# Patient Record
Sex: Female | Born: 1985 | Race: Black or African American | Hispanic: No | Marital: Single | Smoking: Current every day smoker
Health system: Southern US, Community
[De-identification: ages and names within clinical notes are randomized; demographics above are authoritative.]

## PROBLEM LIST (undated history)

## (undated) ENCOUNTER — Inpatient Hospital Stay: Payer: Self-pay

## (undated) DIAGNOSIS — Z789 Other specified health status: Secondary | ICD-10-CM

## (undated) HISTORY — DX: Other specified health status: Z78.9

## (undated) HISTORY — PX: OTHER SURGICAL HISTORY: SHX169

---

## 2007-06-25 ENCOUNTER — Emergency Department: Payer: Self-pay | Admitting: Emergency Medicine

## 2010-03-24 ENCOUNTER — Ambulatory Visit: Payer: Self-pay | Admitting: Orthopedic Surgery

## 2010-12-28 ENCOUNTER — Emergency Department (HOSPITAL_COMMUNITY)
Admission: EM | Admit: 2010-12-28 | Discharge: 2010-12-28 | Payer: Self-pay | Source: Home / Self Care | Admitting: Emergency Medicine

## 2011-02-10 ENCOUNTER — Emergency Department (HOSPITAL_COMMUNITY)
Admission: EM | Admit: 2011-02-10 | Discharge: 2011-02-10 | Disposition: A | Discharge status: Home / Self Care | Payer: Medicaid Other | Attending: Emergency Medicine | Admitting: Emergency Medicine

## 2011-02-10 DIAGNOSIS — M25519 Pain in unspecified shoulder: Secondary | ICD-10-CM | POA: Insufficient documentation

## 2011-02-10 DIAGNOSIS — Z79899 Other long term (current) drug therapy: Secondary | ICD-10-CM | POA: Insufficient documentation

## 2013-09-26 LAB — POCT WET PREP (WET MOUNT)

## 2014-11-22 LAB — POCT WET PREP (WET MOUNT)

## 2016-06-29 ENCOUNTER — Emergency Department (HOSPITAL_COMMUNITY)
Admission: EM | Admit: 2016-06-29 | Discharge: 2016-06-29 | Disposition: A | Discharge status: Home / Self Care | Payer: Medicaid Other | Attending: Emergency Medicine | Admitting: Emergency Medicine

## 2016-06-29 ENCOUNTER — Encounter (HOSPITAL_COMMUNITY): Payer: Self-pay | Admitting: Emergency Medicine

## 2016-06-29 DIAGNOSIS — R109 Unspecified abdominal pain: Secondary | ICD-10-CM

## 2016-06-29 DIAGNOSIS — M549 Dorsalgia, unspecified: Secondary | ICD-10-CM | POA: Insufficient documentation

## 2016-06-29 DIAGNOSIS — O26891 Other specified pregnancy related conditions, first trimester: Secondary | ICD-10-CM | POA: Insufficient documentation

## 2016-06-29 DIAGNOSIS — F17200 Nicotine dependence, unspecified, uncomplicated: Secondary | ICD-10-CM | POA: Insufficient documentation

## 2016-06-29 DIAGNOSIS — O23511 Infections of cervix in pregnancy, first trimester: Secondary | ICD-10-CM | POA: Insufficient documentation

## 2016-06-29 DIAGNOSIS — Z79899 Other long term (current) drug therapy: Secondary | ICD-10-CM | POA: Diagnosis not present

## 2016-06-29 DIAGNOSIS — R51 Headache: Secondary | ICD-10-CM | POA: Diagnosis not present

## 2016-06-29 DIAGNOSIS — N72 Inflammatory disease of cervix uteri: Secondary | ICD-10-CM

## 2016-06-29 DIAGNOSIS — O26899 Other specified pregnancy related conditions, unspecified trimester: Secondary | ICD-10-CM

## 2016-06-29 DIAGNOSIS — O99331 Smoking (tobacco) complicating pregnancy, first trimester: Secondary | ICD-10-CM | POA: Diagnosis not present

## 2016-06-29 DIAGNOSIS — O4691 Antepartum hemorrhage, unspecified, first trimester: Secondary | ICD-10-CM | POA: Diagnosis present

## 2016-06-29 LAB — URINE MICROSCOPIC-ADD ON: Bacteria, UA: NONE SEEN

## 2016-06-29 LAB — CBC WITH DIFFERENTIAL/PLATELET
Basophils Absolute: 0 10*3/uL (ref 0.0–0.1)
Basophils Relative: 0 %
EOS ABS: 0.1 10*3/uL (ref 0.0–0.7)
EOS PCT: 1 %
HCT: 34.8 % — ABNORMAL LOW (ref 36.0–46.0)
HEMOGLOBIN: 12.1 g/dL (ref 12.0–15.0)
Lymphocytes Relative: 16 %
Lymphs Abs: 1.5 10*3/uL (ref 0.7–4.0)
MCH: 33 pg (ref 26.0–34.0)
MCHC: 34.8 g/dL (ref 30.0–36.0)
MCV: 94.8 fL (ref 78.0–100.0)
Monocytes Absolute: 0.4 10*3/uL (ref 0.1–1.0)
Monocytes Relative: 5 %
NEUTROS PCT: 78 %
Neutro Abs: 7.4 10*3/uL (ref 1.7–7.7)
PLATELETS: 206 10*3/uL (ref 150–400)
RBC: 3.67 MIL/uL — ABNORMAL LOW (ref 3.87–5.11)
RDW: 13.1 % (ref 11.5–15.5)
WBC: 9.4 10*3/uL (ref 4.0–10.5)

## 2016-06-29 LAB — URINALYSIS, ROUTINE W REFLEX MICROSCOPIC
Bilirubin Urine: NEGATIVE
GLUCOSE, UA: NEGATIVE mg/dL
KETONES UR: NEGATIVE mg/dL
Nitrite: NEGATIVE
PROTEIN: NEGATIVE mg/dL
Specific Gravity, Urine: 1.005 (ref 1.005–1.030)
pH: 6.5 (ref 5.0–8.0)

## 2016-06-29 LAB — ABO/RH: ABO/RH(D): AB NEG

## 2016-06-29 LAB — WET PREP, GENITAL
Sperm: NONE SEEN
Trich, Wet Prep: NONE SEEN
Yeast Wet Prep HPF POC: NONE SEEN

## 2016-06-29 MED ORDER — LIDOCAINE HCL (PF) 1 % IJ SOLN
INTRAMUSCULAR | Status: AC
  Filled 2016-06-29: qty 5

## 2016-06-29 MED ORDER — AZITHROMYCIN 250 MG PO TABS
1000.0000 mg | ORAL_TABLET | Freq: Once | ORAL | Status: AC
  Administered 2016-06-29: 1000 mg via ORAL
  Filled 2016-06-29: qty 4

## 2016-06-29 MED ORDER — CEFTRIAXONE SODIUM 250 MG IJ SOLR
250.0000 mg | Freq: Once | INTRAMUSCULAR | Status: AC
  Administered 2016-06-29: 250 mg via INTRAMUSCULAR
  Filled 2016-06-29: qty 250

## 2016-06-29 NOTE — Discharge Instructions (Signed)
We have treated you today with antibiotics for cervicitis. Your cultures will not be back for 24 to 48 hours. We will call you if they are positive. You can also review your results on "My chart".

## 2016-06-29 NOTE — ED Notes (Signed)
Patient complaining of lower abdominal cramping and spotting from vagina starting this morning. States she is [redacted] weeks pregnant.

## 2016-06-29 NOTE — ED Provider Notes (Signed)
CSN: [id number]     Arrival date & time 06/29/16  [id number] History   First MD Initiated Contact with Patient 06/29/16 [phone]     Chief Complaint  Patient presents with  . Vaginal Bleeding     (Consider location/radiation/quality/duration/timing/severity/associated sxs/prior Treatment) Patient is a 30 y.o. female presenting with vaginal bleeding. The history is provided by the patient.  Vaginal Bleeding Quality:  Bright red and spotting Severity:  Mild Onset quality:  Sudden Timing:  Intermittent Progression:  Unchanged Chronicity:  New Possible pregnancy: yes   Context: spontaneously   Relieved by:  None tried Worsened by:  Nothing tried Ineffective treatments:  None tried Associated symptoms: abdominal pain (cramping), back pain and vaginal discharge   Associated symptoms: no dysuria, no fever and no nausea    Nancy MarusSamara Pylant is a 30 y.o. G3P2 @ 7755w4d gestation who presents to the ED with vaginal bleeding and cramping. She reports that the cramping started yesterday and she thought maybe she had just been walking to much. Today she noted vaginal bleeding that she describes as spotting. Hx of Chlamydia, current sex partner x 2 months, last pap smear one year ago and was normal. Last sexual intercourse 3 days ago.   History reviewed. No pertinent past medical history. History reviewed. No pertinent past surgical history. History reviewed. No pertinent family history. Social History  Substance Use Topics  . Smoking status: Current Every Day Smoker  . Smokeless tobacco: None  . Alcohol Use: No   OB History    Gravida Para Term Preterm AB TAB SAB Ectopic Multiple Living   1              Review of Systems  Constitutional: Negative for fever and chills.  Eyes: Negative for visual disturbance.  Respiratory: Negative for cough and shortness of breath.   Cardiovascular: Negative for chest pain and leg swelling.  Gastrointestinal: Positive for abdominal pain (cramping). Negative for  nausea and vomiting.  Genitourinary: Positive for frequency, vaginal bleeding and vaginal discharge. Negative for dysuria.  Musculoskeletal: Positive for back pain.  Skin: Negative for rash.  Allergic/Immunologic: Negative for food allergies.  Neurological: Positive for headaches. Negative for syncope.  Hematological: Negative for adenopathy.  Psychiatric/Behavioral: Negative for confusion. The patient is not nervous/anxious.       Allergies  Review of patient's allergies indicates no known allergies.  Home Medications   Prior to Admission medications   Medication Sig Start Date End Date Taking? Authorizing Provider  Prenatal Vit-Fe Fumarate-FA (MULTIVITAMIN-PRENATAL) 27-0.8 MG TABS tablet Take 1 tablet by mouth daily at 12 noon.   Yes Historical Provider, MD   BP 121/80 mmHg  Pulse 102  Temp(Src) 98.4 F (36.9 C) (Oral)  Resp 16  Ht 5\' 2"  (1.575 m)  Wt 63.504 kg  BMI 25.60 kg/m2  SpO2 98% Physical Exam  Constitutional: She is oriented to person, place, and time. She appears well-developed and well-nourished. No distress.  HENT:  Head: Normocephalic.  Eyes: EOM are normal.  Neck: Neck supple.  Cardiovascular: Normal rate and regular rhythm.   Pulmonary/Chest: Effort normal and breath sounds normal.  Abdominal: Soft. Bowel sounds are normal. There is tenderness in the left lower quadrant. There is no rebound, no guarding and no CVA tenderness.  Tenderness is mild  Genitourinary:  External genitalia without lesions, bloody yellow d/c vaginal vault. Cervix friable, external os 1 cm, internal os closed, mild CMT, no adnexal mass, mildly tender left, uterus approximately 12 week size.   Musculoskeletal: Normal range  of motion. She exhibits no edema.  Neurological: She is alert and oriented to person, place, and time. No cranial nerve deficit.  Skin: Skin is warm and dry.  Psychiatric: She has a normal mood and affect. Her behavior is normal.  Nursing note and vitals  reviewed.   ED Course  Procedures (including critical care time) Bedside ultrasound by Dr. Jodi Mourning (see his note)   Labs Review Labs Reviewed  WET PREP, GENITAL - Abnormal; Notable for the following:    Clue Cells Wet Prep HPF POC PRESENT (*)    WBC, Wet Prep HPF POC MANY (*)    All other components within normal limits  URINALYSIS, ROUTINE W REFLEX MICROSCOPIC (NOT AT [facility]) - Abnormal; Notable for the following:    Hgb urine dipstick LARGE (*)    Leukocytes, UA MODERATE (*)    All other components within normal limits  URINE MICROSCOPIC-ADD ON - Abnormal; Notable for the following:    Squamous Epithelial / LPF 0-5 (*)    All other components within normal limits  CBC WITH DIFFERENTIAL/PLATELET - Abnormal; Notable for the following:    RBC 3.67 (*)    HCT 34.8 (*)    All other components within normal limits  RPR  HIV ANTIBODY (ROUTINE TESTING)  ABO/RH  GC/CHLAMYDIA PROBE AMP ([place]) NOT AT [facility]    Imaging Review No results found. I have personally reviewed and evaluated the lab results as part of my medical decision-making.   MDM  30 y.o. female with vaginal bleeding (spotting) @ [redacted] weeks gestation stable for d/c without hemorrhage and cervix closed. Treat for cervicitis with Rocephin 250 mg IM and Zithromax 1 gram PO. Cultures for GC, Chlamydia pending. Discussed with the patient clinical and lab findings and plan of care. All questioned fully answered. She will f/u with her doctor at the health department for prenatal care or return if any problems arise.   Final diagnoses:  Cervicitis  Abdominal pain in pregnancy       Janne Napoleon, NP 06/29/16 1434  Blane Ohara, MD 06/29/16 [phone]

## 2016-06-29 NOTE — ED Notes (Signed)
Lab at bedside

## 2016-06-29 NOTE — ED Notes (Signed)
Ana Manning at bedside for pelvic exam. She was assisted by this nurse. NAD noted.

## 2016-06-30 LAB — RPR: RPR: NONREACTIVE

## 2016-06-30 LAB — GC/CHLAMYDIA PROBE AMP (~~LOC~~) NOT AT ARMC
Chlamydia: NEGATIVE
NEISSERIA GONORRHEA: NEGATIVE

## 2016-06-30 LAB — HIV ANTIBODY (ROUTINE TESTING W REFLEX): HIV Screen 4th Generation wRfx: NONREACTIVE

## 2016-07-14 ENCOUNTER — Emergency Department (HOSPITAL_COMMUNITY)
Admission: EM | Admit: 2016-07-14 | Discharge: 2016-07-14 | Discharge status: Against Medical Advice | Payer: Medicaid Other | Attending: Emergency Medicine | Admitting: Emergency Medicine

## 2016-07-14 ENCOUNTER — Encounter (HOSPITAL_COMMUNITY): Payer: Self-pay

## 2016-07-14 DIAGNOSIS — O99331 Smoking (tobacco) complicating pregnancy, first trimester: Secondary | ICD-10-CM | POA: Insufficient documentation

## 2016-07-14 DIAGNOSIS — O26891 Other specified pregnancy related conditions, first trimester: Secondary | ICD-10-CM | POA: Insufficient documentation

## 2016-07-14 DIAGNOSIS — Z3A14 14 weeks gestation of pregnancy: Secondary | ICD-10-CM | POA: Diagnosis not present

## 2016-07-14 DIAGNOSIS — Z791 Long term (current) use of non-steroidal anti-inflammatories (NSAID): Secondary | ICD-10-CM | POA: Insufficient documentation

## 2016-07-14 DIAGNOSIS — N898 Other specified noninflammatory disorders of vagina: Secondary | ICD-10-CM | POA: Diagnosis not present

## 2016-07-14 DIAGNOSIS — Z79899 Other long term (current) drug therapy: Secondary | ICD-10-CM | POA: Insufficient documentation

## 2016-07-14 DIAGNOSIS — F17210 Nicotine dependence, cigarettes, uncomplicated: Secondary | ICD-10-CM | POA: Diagnosis not present

## 2016-07-14 DIAGNOSIS — R109 Unspecified abdominal pain: Secondary | ICD-10-CM | POA: Diagnosis present

## 2016-07-14 LAB — URINE MICROSCOPIC-ADD ON

## 2016-07-14 LAB — CBC WITH DIFFERENTIAL/PLATELET
BASOS PCT: 0 %
Basophils Absolute: 0 10*3/uL (ref 0.0–0.1)
EOS ABS: 0.1 10*3/uL (ref 0.0–0.7)
Eosinophils Relative: 1 %
HEMATOCRIT: 33.2 % — AB (ref 36.0–46.0)
HEMOGLOBIN: 11.6 g/dL — AB (ref 12.0–15.0)
LYMPHS ABS: 2.5 10*3/uL (ref 0.7–4.0)
Lymphocytes Relative: 21 %
MCH: 33.6 pg (ref 26.0–34.0)
MCHC: 34.9 g/dL (ref 30.0–36.0)
MCV: 96.2 fL (ref 78.0–100.0)
MONO ABS: 0.9 10*3/uL (ref 0.1–1.0)
MONOS PCT: 7 %
NEUTROS ABS: 8.6 10*3/uL — AB (ref 1.7–7.7)
Neutrophils Relative %: 71 %
Platelets: 182 10*3/uL (ref 150–400)
RBC: 3.45 MIL/uL — ABNORMAL LOW (ref 3.87–5.11)
RDW: 13.3 % (ref 11.5–15.5)
WBC: 12.1 10*3/uL — ABNORMAL HIGH (ref 4.0–10.5)

## 2016-07-14 LAB — URINALYSIS, ROUTINE W REFLEX MICROSCOPIC
Bilirubin Urine: NEGATIVE
GLUCOSE, UA: NEGATIVE mg/dL
KETONES UR: NEGATIVE mg/dL
Nitrite: NEGATIVE
PH: 6 (ref 5.0–8.0)
PROTEIN: NEGATIVE mg/dL
Specific Gravity, Urine: >1.03 — ABNORMAL HIGH (ref 1.005–1.030)

## 2016-07-14 LAB — WET PREP, GENITAL
SPERM: NONE SEEN
TRICH WET PREP: NONE SEEN
Yeast Wet Prep HPF POC: NONE SEEN

## 2016-07-14 LAB — BASIC METABOLIC PANEL
Anion gap: 4 — ABNORMAL LOW (ref 5–15)
BUN: 12 mg/dL (ref 6–20)
CALCIUM: 9.3 mg/dL (ref 8.9–10.3)
CHLORIDE: 105 mmol/L (ref 101–111)
CO2: 25 mmol/L (ref 22–32)
CREATININE: 0.58 mg/dL (ref 0.44–1.00)
GFR calc non Af Amer: >60 mL/min (ref >60)
Glucose, Bld: 89 mg/dL (ref 65–99)
Potassium: 4 mmol/L (ref 3.5–5.1)
Sodium: 134 mmol/L — ABNORMAL LOW (ref 135–145)

## 2016-07-14 LAB — HCG, QUANTITATIVE, PREGNANCY: hCG, Beta Chain, Quant, S: 37260 m[IU]/mL — ABNORMAL HIGH (ref <5)

## 2016-07-14 NOTE — ED Provider Notes (Signed)
Emergency Department Provider Note  By signing my name below, I, Linna Darner, attest that this documentation has been prepared under the direction and in the presence of physician practitioner, Maia Plan, MD. Electronically Signed: Linna Darner, Scribe. 07/14/2016. 9:43 PM.   I have reviewed the triage vital signs and the nursing notes.   HISTORY  Chief Complaint Abdominal Pain  HPI Comments: Ana Manning is a 30 y.o. female who presents to the Emergency Department complaining of sudden onset, intermittent, cramping, abdominal pain beginning about 3 hours ago. Pt reports that she has noticed small, light pink patches of blood from her vagina when she wipes after using the bathroom since onset. Pt is 14 weeks and 4 days pregnant; she notes this is her 3rd pregnancy and there were no complications with the previous two. Pt is followed by an OB/GYN, and their office is closed currently so she was advised to come to the ER. She had an ultrasound taken here about a month ago. She denies abdominal pain currently. She further denies leg swelling or any other associated symptoms.   History reviewed. No pertinent past medical history.  There are no active problems to display for this patient.   History reviewed. No pertinent surgical history.  Current Outpatient Rx  . Order #: [id number] Class: Historical Med  . Order #: [id number] Class: Historical Med    Allergies Review of patient's allergies indicates no known allergies.  History reviewed. No pertinent family history.  Social History Social History  Substance Use Topics  . Smoking status: Current Every Day Smoker    Packs/day: 0.50    Types: Cigarettes  . Smokeless tobacco: Never Used  . Alcohol use No    Review of Systems  Constitutional: No fever/chills Eyes: No visual changes. ENT: No sore throat. Cardiovascular: Denies chest pain. Respiratory: Denies shortness of breath. Gastrointestinal: Positive for abdominal  pain.  No nausea, no vomiting.  No diarrhea.  No constipation. Genitourinary: Negative for dysuria. Positive for vaginal bleeding. Musculoskeletal: Negative for back pain. Negative for joint swelling (legs). Skin: Negative for rash. Neurological: Negative for headaches, focal weakness or numbness.  10-point ROS otherwise negative.  ____________________________________________   PHYSICAL EXAM:  VITAL SIGNS: ED Triage Vitals  Enc Vitals Group     BP 07/14/16 1909 124/75     Pulse Rate 07/14/16 1909 112     Resp 07/14/16 1909 18     Temp 07/14/16 1909 98.5 F (36.9 C)     Temp Source 07/14/16 1909 Oral     SpO2 07/14/16 1909 100 %     Weight 07/14/16 1909 140 lb 9 oz (63.8 kg)     Height 07/14/16 1909  (1.575 m)     Pain Score 07/14/16 1911 7   Constitutional: Alert and oriented. Well appearing and in no acute distress. Eyes: Conjunctivae are normal. PERRL. Head: Atraumatic. Nose: No congestion/rhinnorhea. Mouth/Throat: Mucous membranes are moist.  Oropharynx non-erythematous. Neck: No stridor. Cardiovascular: Normal rate, regular rhythm. Good peripheral circulation. Grossly normal heart sounds.   Respiratory: Normal respiratory effort.  No retractions. Lungs CTAB. Gastrointestinal: Soft and nontender. No distention.  Genitourinary: Moderate vaginal discharge. Cervix visually closed. No gross blood or clotting.  Musculoskeletal: No lower extremity tenderness nor edema. No gross deformities of extremities. Neurologic:  Normal speech and language. No gross focal neurologic deficits are appreciated.  Skin:  Skin is warm, dry and intact. No rash noted. Psychiatric: Mood and affect are normal. Speech and behavior are normal.  ____________________________________________  LABS (all labs ordered are listed, but only abnormal results are displayed)  Labs Reviewed  CBC WITH DIFFERENTIAL/PLATELET - Abnormal; Notable for the following:       Result Value   WBC 12.1 (*)     RBC 3.45 (*)    Hemoglobin 11.6 (*)    HCT 33.2 (*)    Neutro Abs 8.6 (*)    All other components within normal limits  URINALYSIS, ROUTINE W REFLEX MICROSCOPIC (NOT AT [facility])  HCG, QUANTITATIVE, PREGNANCY  BASIC METABOLIC PANEL   ____________________________________________  RADIOLOGY  None ____________________________________________   PROCEDURES  Procedure(s) performed:   Procedures  EMERGENCY DEPARTMENT [place] PREGNANCY "Study: Limited Ultrasound of the Pelvis for Pregnancy"  INDICATIONS:Pregnancy(required) Multiple views of the uterus and pelvic cavity were obtained in real-time with a multi-frequency probe.  APPROACH:Transabdominal   PERFORMED BY: Myself  IMAGES ARCHIVED?: Yes  LIMITATIONS: None  PREGNANCY FREE FLUID: None  ADNEXAL FINDINGS: Not visualized  PREGNANCY FINDINGS: IUP visualized with active fetal movement and heart activity.   INTERPRETATION: Viable intrauterine pregnancy  GESTATIONAL AGE, ESTIMATE: 14w  FETAL HEART RATE: Not estimated  CPT Codes:  78588-50 (transabdominal OB)  768-26-52 (transvaginal OB, Reduced level of service for incomplete exam)  ____________________________________________   INITIAL IMPRESSION / ASSESSMENT AND PLAN / ED COURSE  Pertinent labs & imaging results that were available during my care of the patient were reviewed by me and considered in my medical decision making (see chart for details).  Patient presents to the ED at [redacted]w[redacted]d by dates for evaluation of vaginal bleeding. On bedside US there is a visualized IUP with positive heart activity and fetal movement. The cervix is visually closed on pelvic exam. Bimanual exam deferred with unknown placenta location. Patient has established care with an OB. No gross blood or clot on vaginal exam. Does have increased vaginal discharge. The patient left the ED AMA prior to UA and wet prep results. Sent gonorrhea and chlamydia as well. Patient reported on the phone when called  by nursing that she will follow with her OB/Gyn for any further results. Encouraged her to return to the ED at any time if she has further concerns or questions.    ____________________________________________  FINAL CLINICAL IMPRESSION(S) / ED DIAGNOSES  Final diagnoses:  Vaginal discharge     MEDICATIONS GIVEN DURING THIS VISIT:  None  NEW OUTPATIENT MEDICATIONS STARTED DURING THIS VISIT:  None   Note:  This document was prepared using Dragon voice recognition software and may include unintentional dictation errors.  Alona Bene, MD Emergency Medicine   Maia Plan, MD 07/15/16 1155

## 2016-07-14 NOTE — ED Triage Notes (Signed)
I woke up and was constipated.  I went to the bathroom, then when I wiped there was blood clots on the tissue.  I called the health department and they told me to come here.  Having some abdominal pain and cramping off and on.  LMP May 20, due date Jan 25th.  14 weeks 4 days.  3rd pregnancy.

## 2016-07-14 NOTE — ED Notes (Addendum)
Patient called from outside line, states she couldn't wait any longer and has already left. Requesting that the physician call her with her results I told her that when patients leave AMA, the physicians do not call with results, however if she tests positive for a STD the flow manager from [facility] with call her.   Dr. Jacqulyn Bath informed of the above.

## 2016-07-16 LAB — GC/CHLAMYDIA PROBE AMP (~~LOC~~) NOT AT ARMC
Chlamydia: NEGATIVE
Neisseria Gonorrhea: NEGATIVE

## 2016-12-12 ENCOUNTER — Observation Stay
Admission: EM | Admit: 2016-12-12 | Discharge: 2016-12-12 | Disposition: A | Discharge status: Home / Self Care | Payer: Medicaid Other | Attending: Obstetrics and Gynecology | Admitting: Obstetrics and Gynecology

## 2016-12-12 ENCOUNTER — Encounter: Payer: Self-pay | Admitting: *Deleted

## 2016-12-12 DIAGNOSIS — Z3483 Encounter for supervision of other normal pregnancy, third trimester: Secondary | ICD-10-CM | POA: Diagnosis not present

## 2016-12-12 DIAGNOSIS — O4703 False labor before 37 completed weeks of gestation, third trimester: Secondary | ICD-10-CM

## 2016-12-12 DIAGNOSIS — Z3490 Encounter for supervision of normal pregnancy, unspecified, unspecified trimester: Secondary | ICD-10-CM

## 2016-12-12 LAB — URINALYSIS, COMPLETE (UACMP) WITH MICROSCOPIC
BILIRUBIN URINE: NEGATIVE
Bacteria, UA: NONE SEEN
GLUCOSE, UA: NEGATIVE mg/dL
Ketones, ur: 5 mg/dL — AB
NITRITE: NEGATIVE
PH: 6 (ref 5.0–8.0)
Protein, ur: NEGATIVE mg/dL
SPECIFIC GRAVITY, URINE: 1.006 (ref 1.005–1.030)

## 2016-12-12 MED ORDER — TERBUTALINE SULFATE 1 MG/ML IJ SOLN: 0.2500 mg | Freq: Once | INTRAMUSCULAR | Status: DC

## 2016-12-12 MED ORDER — OXYCODONE-ACETAMINOPHEN 5-325 MG PO TABS
ORAL_TABLET | ORAL | Status: AC
  Administered 2016-12-12: 2 via ORAL
  Filled 2016-12-12: qty 2

## 2016-12-12 MED ORDER — TERBUTALINE SULFATE 1 MG/ML IJ SOLN
0.2500 mg | Freq: Once | INTRAMUSCULAR | Status: AC
  Administered 2016-12-12: 0.25 mg via SUBCUTANEOUS

## 2016-12-12 MED ORDER — OXYCODONE-ACETAMINOPHEN 5-325 MG PO TABS
2.0000 | ORAL_TABLET | Freq: Once | ORAL | Status: AC
  Administered 2016-12-12: 2 via ORAL

## 2016-12-12 MED ORDER — TERBUTALINE SULFATE 1 MG/ML IJ SOLN
INTRAMUSCULAR | Status: AC
  Administered 2016-12-12: 0.25 mg via SUBCUTANEOUS
  Filled 2016-12-12: qty 1

## 2016-12-12 MED ORDER — LACTATED RINGERS IV BOLUS (SEPSIS)
500.0000 mL | Freq: Once | INTRAVENOUS | Status: AC
  Administered 2016-12-12: 500 mL via INTRAVENOUS

## 2016-12-12 NOTE — Final Progress Note (Signed)
L&D OB Triage Note  Ana Manning is a 30 y.o. G3P2002 unassigned female at 3110w2d, EDD Estimated Date of Delivery: 01/07/17 who presented to triage for complaints of painful contractions, 8/10 on scale. [facility]  She was evaluated by the nurses with findings significant for contractions every 6 minutes. Vital signs stable. An NST was performed and has been reviewed by MD. She was treated with IV fluid bolus, an IM dose of Terbutaline 0.25 mg, and Percocet.    Physical Exam:  Blood pressure (!) 114/56, pulse (!) 121, temperature 97.7 F (36.5 C), temperature source Oral, resp. rate 14, height 5' 1.5" (1.562 m), weight 146 lb (66.2 kg).  Cervix: closed/thick (unchanged on repeat exam ~ 2 hrs later)   NST INTERPRETATION: Indications: rule out uterine contractions  Mode: External Baseline Rate (A): 135 bpm Variability: Moderate Accelerations: 15 x 15 Decelerations: None     Contraction Frequency (min): 3-5/irregular  Impression: reactive    Labs:   Results for orders placed or performed during the hospital encounter of 12/12/16  Urinalysis, Complete w Microscopic  Result Value Ref Range   Color, Urine YELLOW (A) YELLOW   APPearance HAZY (A) CLEAR   Specific Gravity, Urine 1.006 1.005 - 1.030   pH 6.0 5.0 - 8.0   Glucose, UA NEGATIVE NEGATIVE mg/dL   Hgb urine dipstick MODERATE (A) NEGATIVE   Bilirubin Urine NEGATIVE NEGATIVE   Ketones, ur 5 (A) NEGATIVE mg/dL   Protein, ur NEGATIVE NEGATIVE mg/dL   Nitrite NEGATIVE NEGATIVE   Leukocytes, UA LARGE (A) NEGATIVE   RBC / HPF 0-5 0 - 5 RBC/hpf   WBC, UA TOO NUMEROUS TO COUNT 0 - 5 WBC/hpf   Bacteria, UA NONE SEEN NONE SEEN   Squamous Epithelial / LPF 6-30 (A) NONE SEEN   WBC Clumps PRESENT    Amorphous Crystal PRESENT      Plan: NST performed was reviewed and was found to be reactive. Patient's contractions spaced to every 13-15 minutes, no longer detectable to patient.   Her pain was controlled after receiving Percocet.  She was discharged home with bleeding/labor precautions.  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  Will provide note to remain out of work x 1 week.  To discuss further work restrictions with OB provider. Continue routine prenatal care. Follow up with OB/GYN as previously scheduled in 1 week.     Hildred LaserAnika Finlee Concepcion, MD [facility]

## 2016-12-12 NOTE — Discharge Instructions (Signed)
Please keep scheduled appointment for 12[facility]/16 [facility].  Work note to be out until Monday 12/21/16.  Please call the physician on call for any questions or concerns.  You may also call the nurse's desk at the [facility] at [phone].  If you have urgent concerns please go to the nearest Emergency Department for evaluation.

## 2017-10-17 ENCOUNTER — Encounter (HOSPITAL_COMMUNITY): Payer: Self-pay

## 2017-12-14 NOTE — L&D Delivery Note (Signed)
Patient: Ana Manning MRN: [id number]  GBS status: postive, inadequate IAP given (Ampicillin at 17:16)  Patient is a 32 y.o. now [id number] s/p NSVD at 2848w1d, who was admitted for SOL. SROM 0h 8471m prior to delivery with clear fluid.   Delivery Note At 6:12 PM a viable female was delivered via VBAC, Spontaneous (Presentation: vertex; LOA).   APGAR: 8, 9; weight 6 lb 2.4 oz (2790 g).   Placenta status: intact, sent to L&D.   Cord: 3-vessel  Anesthesia:  Epidural Episiotomy: None Lacerations: 1st degree Suture Repair: none Est. Blood Loss (mL): 100  Called to the room as head delivering. Arrived to the room and noted head delivering. I immediately reached for baby's head, no nuchal cord noted, and shoulders and body easily delivered. Infant with spontaneous cry, placed on mother's abdomen, dried and bulb suctioned. Cord clamped x 2 after 1-minute delay, and cut by family member. Cord blood drawn. Placenta delivered spontaneously with gentle cord traction. Fundus firm with massage and Pitocin. Perineum inspected and found to have first degree laceration which was hemostatic and did not require repair.  Mom to postpartum.  Baby to Couplet care / Skin to Skin.  Raynelle FanningJulie P. Rhegan Trunnell, MD OB Fellow 07/04/18, 6:59 PM

## 2018-01-12 LAB — OB RESULTS CONSOLE RUBELLA ANTIBODY, IGM: Rubella: IMMUNE

## 2018-01-12 LAB — OB RESULTS CONSOLE ABO/RH: RH Type: POSITIVE

## 2018-01-12 LAB — OB RESULTS CONSOLE ANTIBODY SCREEN: ANTIBODY SCREEN: NEGATIVE

## 2018-01-20 ENCOUNTER — Encounter: Payer: Self-pay | Admitting: *Deleted

## 2018-01-20 DIAGNOSIS — Z98891 History of uterine scar from previous surgery: Secondary | ICD-10-CM | POA: Insufficient documentation

## 2018-01-20 DIAGNOSIS — O0932 Supervision of pregnancy with insufficient antenatal care, second trimester: Secondary | ICD-10-CM | POA: Insufficient documentation

## 2018-01-20 DIAGNOSIS — O09899 Supervision of other high risk pregnancies, unspecified trimester: Secondary | ICD-10-CM

## 2018-02-01 ENCOUNTER — Other Ambulatory Visit: Payer: Self-pay | Admitting: Obstetrics & Gynecology

## 2018-02-01 DIAGNOSIS — Z363 Encounter for antenatal screening for malformations: Secondary | ICD-10-CM

## 2018-02-02 ENCOUNTER — Ambulatory Visit (INDEPENDENT_AMBULATORY_CARE_PROVIDER_SITE_OTHER): Payer: Medicaid Other

## 2018-02-02 ENCOUNTER — Ambulatory Visit (INDEPENDENT_AMBULATORY_CARE_PROVIDER_SITE_OTHER): Payer: Medicaid Other | Admitting: Advanced Practice Midwife

## 2018-02-02 ENCOUNTER — Ambulatory Visit: Payer: Medicaid Other | Admitting: *Deleted

## 2018-02-02 ENCOUNTER — Encounter (INDEPENDENT_AMBULATORY_CARE_PROVIDER_SITE_OTHER): Payer: Self-pay

## 2018-02-02 ENCOUNTER — Encounter: Payer: Self-pay | Admitting: Advanced Practice Midwife

## 2018-02-02 ENCOUNTER — Other Ambulatory Visit: Payer: Self-pay

## 2018-02-02 VITALS — BP 98/66 | HR 84 | Wt 128.0 lb

## 2018-02-02 DIAGNOSIS — O0932 Supervision of pregnancy with insufficient antenatal care, second trimester: Secondary | ICD-10-CM

## 2018-02-02 DIAGNOSIS — F17210 Nicotine dependence, cigarettes, uncomplicated: Secondary | ICD-10-CM | POA: Diagnosis not present

## 2018-02-02 DIAGNOSIS — O99331 Smoking (tobacco) complicating pregnancy, first trimester: Secondary | ICD-10-CM | POA: Diagnosis not present

## 2018-02-02 DIAGNOSIS — Z3490 Encounter for supervision of normal pregnancy, unspecified, unspecified trimester: Secondary | ICD-10-CM | POA: Insufficient documentation

## 2018-02-02 DIAGNOSIS — O09899 Supervision of other high risk pregnancies, unspecified trimester: Secondary | ICD-10-CM

## 2018-02-02 DIAGNOSIS — Z363 Encounter for antenatal screening for malformations: Secondary | ICD-10-CM

## 2018-02-02 DIAGNOSIS — Z362 Encounter for other antenatal screening follow-up: Secondary | ICD-10-CM | POA: Diagnosis not present

## 2018-02-02 DIAGNOSIS — O34219 Maternal care for unspecified type scar from previous cesarean delivery: Secondary | ICD-10-CM

## 2018-02-02 DIAGNOSIS — Z3A20 20 weeks gestation of pregnancy: Secondary | ICD-10-CM | POA: Diagnosis not present

## 2018-02-02 DIAGNOSIS — Z1389 Encounter for screening for other disorder: Secondary | ICD-10-CM

## 2018-02-02 DIAGNOSIS — Z3482 Encounter for supervision of other normal pregnancy, second trimester: Secondary | ICD-10-CM

## 2018-02-02 DIAGNOSIS — Z98891 History of uterine scar from previous surgery: Secondary | ICD-10-CM

## 2018-02-02 DIAGNOSIS — Z331 Pregnant state, incidental: Secondary | ICD-10-CM

## 2018-02-02 LAB — POCT URINALYSIS DIPSTICK
Blood, UA: NEGATIVE
Glucose, UA: NEGATIVE
KETONES UA: NEGATIVE
LEUKOCYTES UA: NEGATIVE
NITRITE UA: NEGATIVE
PROTEIN UA: NEGATIVE

## 2018-02-02 NOTE — Progress Notes (Signed)
US 16+3 wks,cephalic,anterior pl gr 0,normal ovaries bilat,efw 155 g,fhr 159 bpm,cx 3.3 cm,please have pt come back for additional images of heart,no obvious abnormalities

## 2018-02-02 NOTE — Patient Instructions (Signed)
 First Trimester of Pregnancy The first trimester of pregnancy is from week 1 until the end of week 12 (months 1 through 3). A week after a sperm fertilizes an egg, the egg will implant on the wall of the uterus. This embryo will begin to develop into a baby. Genes from you and your partner are forming the baby. The female genes determine whether the baby is a boy or a girl. At 6-8 weeks, the eyes and face are formed, and the heartbeat can be seen on ultrasound. At the end of 12 weeks, all the baby's organs are formed.  Now that you are pregnant, you will want to do everything you can to have a healthy baby. Two of the most important things are to get good prenatal care and to follow your health care provider's instructions. Prenatal care is all the medical care you receive before the baby's birth. This care will help prevent, find, and treat any problems during the pregnancy and childbirth. BODY CHANGES Your body goes through many changes during pregnancy. The changes vary from woman to woman.   You may gain or lose a couple of pounds at first.  You may feel sick to your stomach (nauseous) and throw up (vomit). If the vomiting is uncontrollable, call your health care provider.  You may tire easily.  You may develop headaches that can be relieved by medicines approved by your health care provider.  You may urinate more often. Painful urination may mean you have a bladder infection.  You may develop heartburn as a result of your pregnancy.  You may develop constipation because certain hormones are causing the muscles that push waste through your intestines to slow down.  You may develop hemorrhoids or swollen, bulging veins (varicose veins).  Your breasts may begin to grow larger and become tender. Your nipples may stick out more, and the tissue that surrounds them (areola) may become darker.  Your gums may bleed and may be sensitive to brushing and flossing.  Dark spots or blotches  (chloasma, mask of pregnancy) may develop on your face. This will likely fade after the baby is born.  Your menstrual periods will stop.  You may have a loss of appetite.  You may develop cravings for certain kinds of food.  You may have changes in your emotions from day to day, such as being excited to be pregnant or being concerned that something may go wrong with the pregnancy and baby.  You may have more vivid and strange dreams.  You may have changes in your hair. These can include thickening of your hair, rapid growth, and changes in texture. Some women also have hair loss during or after pregnancy, or hair that feels dry or thin. Your hair will most likely return to normal after your baby is born. WHAT TO EXPECT AT YOUR PRENATAL VISITS During a routine prenatal visit:  You will be weighed to make sure you and the baby are growing normally.  Your blood pressure will be taken.  Your abdomen will be measured to track your baby's growth.  The fetal heartbeat will be listened to starting around week 10 or 12 of your pregnancy.  Test results from any previous visits will be discussed. Your health care provider may ask you:  How you are feeling.  If you are feeling the baby move.  If you have had any abnormal symptoms, such as leaking fluid, bleeding, severe headaches, or abdominal cramping.  If you have any questions. Other   tests that may be performed during your first trimester include:  Blood tests to find your blood type and to check for the presence of any previous infections. They will also be used to check for low iron levels (anemia) and Rh antibodies. Later in the pregnancy, blood tests for diabetes will be done along with other tests if problems develop.  Urine tests to check for infections, diabetes, or protein in the urine.  An ultrasound to confirm the proper growth and development of the baby.  An amniocentesis to check for possible genetic problems.  Fetal  screens for spina bifida and Down syndrome.  You may need other tests to make sure you and the baby are doing well. HOME CARE INSTRUCTIONS  Medicines  Follow your health care provider's instructions regarding medicine use. Specific medicines may be either safe or unsafe to take during pregnancy.  Take your prenatal vitamins as directed.  If you develop constipation, try taking a stool softener if your health care provider approves. Diet  Eat regular, well-balanced meals. Choose a variety of foods, such as meat or vegetable-based protein, fish, milk and low-fat dairy products, vegetables, fruits, and whole grain breads and cereals. Your health care provider will help you determine the amount of weight gain that is right for you.  Avoid raw meat and uncooked cheese. These carry germs that can cause birth defects in the baby.  Eating four or five small meals rather than three large meals a day may help relieve nausea and vomiting. If you start to feel nauseous, eating a few soda crackers can be helpful. Drinking liquids between meals instead of during meals also seems to help nausea and vomiting.  If you develop constipation, eat more high-fiber foods, such as fresh vegetables or fruit and whole grains. Drink enough fluids to keep your urine clear or pale yellow. Activity and Exercise  Exercise only as directed by your health care provider. Exercising will help you:  Control your weight.  Stay in shape.  Be prepared for labor and delivery.  Experiencing pain or cramping in the lower abdomen or low back is a good sign that you should stop exercising. Check with your health care provider before continuing normal exercises.  Try to avoid standing for long periods of time. Move your legs often if you must stand in one place for a long time.  Avoid heavy lifting.  Wear low-heeled shoes, and practice good posture.  You may continue to have sex unless your health care provider directs you  otherwise. Relief of Pain or Discomfort  Wear a good support bra for breast tenderness.   Take warm sitz baths to soothe any pain or discomfort caused by hemorrhoids. Use hemorrhoid cream if your health care provider approves.   Rest with your legs elevated if you have leg cramps or low back pain.  If you develop varicose veins in your legs, wear support hose. Elevate your feet for 15 minutes, 3-4 times a day. Limit salt in your diet. Prenatal Care  Schedule your prenatal visits by the twelfth week of pregnancy. They are usually scheduled monthly at first, then more often in the last 2 months before delivery.  Write down your questions. Take them to your prenatal visits.  Keep all your prenatal visits as directed by your health care provider. Safety  Wear your seat belt at all times when driving.  Make a list of emergency phone numbers, including numbers for family, friends, the hospital, and police and fire departments. General   Tips  Ask your health care provider for a referral to a local prenatal education class. Begin classes no later than at the beginning of month 6 of your pregnancy.  Ask for help if you have counseling or nutritional needs during pregnancy. Your health care provider can offer advice or refer you to specialists for help with various needs.  Do not use hot tubs, steam rooms, or saunas.  Do not douche or use tampons or scented sanitary pads.  Do not cross your legs for long periods of time.  Avoid cat litter boxes and soil used by cats. These carry germs that can cause birth defects in the baby and possibly loss of the fetus by miscarriage or stillbirth.  Avoid all smoking, herbs, alcohol, and medicines not prescribed by your health care provider. Chemicals in these affect the formation and growth of the baby.  Schedule a dentist appointment. At home, brush your teeth with a soft toothbrush and be gentle when you floss. SEEK MEDICAL CARE IF:   You have  dizziness.  You have mild pelvic cramps, pelvic pressure, or nagging pain in the abdominal area.  You have persistent nausea, vomiting, or diarrhea.  You have a bad smelling vaginal discharge.  You have pain with urination.  You notice increased swelling in your face, hands, legs, or ankles. SEEK IMMEDIATE MEDICAL CARE IF:   You have a fever.  You are leaking fluid from your vagina.  You have spotting or bleeding from your vagina.  You have severe abdominal cramping or pain.  You have rapid weight gain or loss.  You vomit blood or material that looks like coffee grounds.  You are exposed to German measles and have never had them.  You are exposed to fifth disease or chickenpox.  You develop a severe headache.  You have shortness of breath.  You have any kind of trauma, such as from a fall or a car accident. Document Released: 11/24/2001 Document Revised: 04/16/2014 Document Reviewed: 10/10/2013 Exit[employer] Patient Information 2015 [employer]. This information is not intended to replace advice given to you by your health care provider. Make sure you discuss any questions you have with your health care provider.   Nausea & Vomiting  Have saltine crackers or pretzels by your bed and eat a few bites before you raise your head out of bed in the morning  Eat small frequent meals throughout the day instead of large meals  Drink plenty of fluids throughout the day to stay hydrated, just don't drink a lot of fluids with your meals.  This can make your stomach fill up faster making you feel sick  Do not brush your teeth right after you eat  Products with real ginger are good for nausea, like ginger ale and ginger hard candy Make sure it says made with real ginger!  Sucking on sour candy like lemon heads is also good for nausea  If your prenatal vitamins make you nauseated, take them at night so you will sleep through the nausea  Sea Bands  If you feel like you need  medicine for the nausea & vomiting please let us know  If you are unable to keep any fluids or food down please let us know   Constipation  Drink plenty of fluid, preferably water, throughout the day  Eat foods high in fiber such as fruits, vegetables, and grains  Exercise, such as walking, is a good way to keep your bowels regular  Drink warm fluids, especially warm   prune juice, or decaf coffee  Eat a 1/2 cup of real oatmeal (not instant), 1/2 cup applesauce, and 1/2-1 cup warm prune juice every day  If needed, you may take Colace (docusate sodium) stool softener once or twice a day to help keep the stool soft. If you are pregnant, wait until you are out of your first trimester (12-14 weeks of pregnancy)  If you still are having problems with constipation, you may take Miralax once daily as needed to help keep your bowels regular.  If you are pregnant, wait until you are out of your first trimester (12-14 weeks of pregnancy)  Safe Medications in Pregnancy   Acne: Benzoyl Peroxide Salicylic Acid  Backache/Headache: Tylenol: 2 regular strength every 4 hours OR              2 Extra strength every 6 hours  Colds/Coughs/Allergies: Benadryl (alcohol free) 25 mg every 6 hours as needed Breath right strips Claritin Cepacol throat lozenges Chloraseptic throat spray Cold-Eeze- up to three times per day Cough drops, alcohol free Flonase (by prescription only) Guaifenesin Mucinex Robitussin DM (plain only, alcohol free) Saline nasal spray/drops Sudafed (pseudoephedrine) & Actifed ** use only after [redacted] weeks gestation and if you do not have high blood pressure Tylenol Vicks Vaporub Zinc lozenges Zyrtec   Constipation: Colace Ducolax suppositories Fleet enema Glycerin suppositories Metamucil Milk of magnesia Miralax Senokot Smooth move tea  Diarrhea: Kaopectate Imodium A-D  *NO pepto Bismol  Hemorrhoids: Anusol Anusol HC Preparation  H Tucks  Indigestion: Tums Maalox Mylanta Zantac  Pepcid  Insomnia: Benadryl (alcohol free) 25mg every 6 hours as needed Tylenol PM Unisom, no Gelcaps  Leg Cramps: Tums MagGel  Nausea/Vomiting:  Bonine Dramamine Emetrol Ginger extract Sea bands Meclizine  Nausea medication to take during pregnancy:  Unisom (doxylamine succinate 25 mg tablets) Take one tablet daily at bedtime. If symptoms are not adequately controlled, the dose can be increased to a maximum recommended dose of two tablets daily (1/2 tablet in the morning, 1/2 tablet mid-afternoon and one at bedtime). Vitamin B6 100mg tablets. Take one tablet twice a day (up to 200 mg per day).  Skin Rashes: Aveeno products Benadryl cream or 25mg every 6 hours as needed Calamine Lotion 1% cortisone cream  Yeast infection: Gyne-lotrimin 7 Monistat 7   **If taking multiple medications, please check labels to avoid duplicating the same active ingredients **take medication as directed on the label ** Do not exceed 4000 mg of tylenol in 24 hours **Do not take medications that contain aspirin or ibuprofen      

## 2018-02-02 NOTE — Progress Notes (Signed)
Subjective:    Ana Manning is a [id number] 2037w3d by LMP only being seen today for her first obstetrical visit. [facility]  Her obstetrical history is significant for cs for FTP and 2 successful VBACs.  Pregnancy history fully reviewed. Patient reports no complaints. States she doesn't believe her EDC (4 weeks different from LMP), wonders if baby is just small. Discussed that an IUGR baby at 20 weeks would be dire, all measurements were the same; agrees to wait until next US--if growth is normal, will stick w/ 8/4. Upset about a girl baby.  Smoking less, says the Internet said ecigs were worse than regular. Discussed nicotine vs tar, smoke, etc. Cessation strategies discussed.   May try a jewel.  Vitals:   02/02/18 1110  BP: 98/66  Pulse: 84  Weight: 128 lb (58.1 kg)    HISTORY: OB History  Gravida Para Term Preterm AB Living  4 3 3     3   SAB TAB Ectopic Multiple Live Births          3    # Outcome Date GA Lbr Len/2nd Weight Sex Delivery Anes PTL Lv  4 Current           3 Term 12/2016 2260w0d  6 lb 11 oz (3.033 kg) M VBAC EPI N LIV  2 Term 06/20/01 6823w0d  7 lb 8 oz (3.402 kg) M VBAC EPI N LIV  1 Term 05/25/99 8223w0d  7 lb 13 oz (3.544 kg) M CS-LVertical EPI N LIV     Complications: Failure to Progress in First Stage     Past Medical History:  Diagnosis Date  . Medical history non-contributory    Past Surgical History:  Procedure Laterality Date  . shoulder sugery Right    Family History  Problem Relation Age of Onset  . Multiple births Maternal Grandmother   . Lupus Maternal Grandfather   . ADD / ADHD Son   . ADD / ADHD Son   . Lupus Father      Exam                                      System:     Skin: normal coloration and turgor, no rashes    Neurologic: oriented, normal, normal mood   Extremities: normal strength, tone, and muscle mass   HEENT PERRLA   Mouth/Teeth mucous membranes moist, normal dentition   Neck supple and  no masses   Cardiovascular: regular rate and rhythm   Respiratory:  appears well, vitals normal, no respiratory distress, acyanotic   Abdomen: soft, non-tender;  FHR:        US 16+3 wks,cephalic,anterior pl gr 0,normal ovaries bilat,efw 155 g,fhr 159 bpm,cx 3.3 cm,please have pt come back for additional images of heart,no obvious abnormalities   The nature of [facility] with multiple MDs and other Advanced Practice Providers was explained to patient; also emphasized that residents, students are part of our team.  Assessment:    Pregnancy: [id number] Patient Active Problem List   Diagnosis Date Noted  . Supervision of normal pregnancy 02/02/2018  . Short interval between pregnancies affecting pregnancy, antepartum 01/20/2018  . Late prenatal care in second trimester 01/20/2018  . History of cesarean delivery 01/20/2018  . History of vaginal delivery following previous cesarean delivery 01/20/2018  . Pregnancy 12/12/2016  Plan:     EDC changed based on today's [place] Continue prenatal vitamins  Problem list reviewed and updated  Reviewed n/v relief measures and warning s/s to report  Reviewed recommended weight gain based on pre-gravid BMI  Encouraged well-balanced diet Genetic Screening discussed Quad Screen: declined.  Ultrasound discussed; fetal survey: requested.  Return in about 3 weeks (around 02/23/2018) for LROB, [place] to recheck anaotomy.  Jacklyn Shell 02/03/2018

## 2018-02-03 ENCOUNTER — Encounter: Payer: Self-pay | Admitting: Advanced Practice Midwife

## 2018-02-03 DIAGNOSIS — F1290 Cannabis use, unspecified, uncomplicated: Secondary | ICD-10-CM | POA: Insufficient documentation

## 2018-02-03 LAB — PMP SCREEN PROFILE (10S), URINE
AMPHETAMINE SCREEN URINE: NEGATIVE ng/mL
BARBITURATE SCREEN URINE: NEGATIVE ng/mL
BENZODIAZEPINE SCREEN, URINE: NEGATIVE ng/mL
CANNABINOIDS UR QL SCN: POSITIVE ng/mL — AB
CREATININE(CRT), U: 24.8 mg/dL (ref 20.0–300.0)
Cocaine (Metab) Scrn, Ur: NEGATIVE ng/mL
Methadone Screen, Urine: NEGATIVE ng/mL
OPIATE SCREEN URINE: NEGATIVE ng/mL
OXYCODONE+OXYMORPHONE UR QL SCN: NEGATIVE ng/mL
PHENCYCLIDINE QUANTITATIVE URINE: NEGATIVE ng/mL
Ph of Urine: 6 (ref 4.5–8.9)
Propoxyphene Scrn, Ur: NEGATIVE ng/mL

## 2018-02-03 LAB — GC/CHLAMYDIA PROBE AMP
CHLAMYDIA, DNA PROBE: NEGATIVE
Neisseria gonorrhoeae by PCR: NEGATIVE

## 2018-02-24 ENCOUNTER — Encounter: Payer: Medicaid Other | Admitting: Women's Health

## 2018-02-24 ENCOUNTER — Other Ambulatory Visit: Payer: Medicaid Other

## 2018-02-24 ENCOUNTER — Other Ambulatory Visit: Payer: Self-pay | Admitting: Advanced Practice Midwife

## 2018-02-24 DIAGNOSIS — Z3482 Encounter for supervision of other normal pregnancy, second trimester: Secondary | ICD-10-CM

## 2018-02-24 DIAGNOSIS — Z0489 Encounter for examination and observation for other specified reasons: Secondary | ICD-10-CM

## 2018-02-24 DIAGNOSIS — IMO0002 Reserved for concepts with insufficient information to code with codable children: Secondary | ICD-10-CM

## 2018-03-07 ENCOUNTER — Ambulatory Visit (INDEPENDENT_AMBULATORY_CARE_PROVIDER_SITE_OTHER): Payer: Medicaid Other

## 2018-03-07 ENCOUNTER — Encounter (INDEPENDENT_AMBULATORY_CARE_PROVIDER_SITE_OTHER): Payer: Self-pay

## 2018-03-07 DIAGNOSIS — IMO0002 Reserved for concepts with insufficient information to code with codable children: Secondary | ICD-10-CM

## 2018-03-07 DIAGNOSIS — O0932 Supervision of pregnancy with insufficient antenatal care, second trimester: Secondary | ICD-10-CM

## 2018-03-07 DIAGNOSIS — Z3482 Encounter for supervision of other normal pregnancy, second trimester: Secondary | ICD-10-CM

## 2018-03-07 DIAGNOSIS — Z3A21 21 weeks gestation of pregnancy: Secondary | ICD-10-CM

## 2018-03-07 DIAGNOSIS — Z0489 Encounter for examination and observation for other specified reasons: Secondary | ICD-10-CM | POA: Diagnosis not present

## 2018-03-07 DIAGNOSIS — F1290 Cannabis use, unspecified, uncomplicated: Secondary | ICD-10-CM

## 2018-03-07 DIAGNOSIS — O09899 Supervision of other high risk pregnancies, unspecified trimester: Secondary | ICD-10-CM

## 2018-03-07 DIAGNOSIS — Z98891 History of uterine scar from previous surgery: Secondary | ICD-10-CM

## 2018-03-07 NOTE — Progress Notes (Signed)
US 21+1 wks,cephalic,cx length 3.5 cm,anterior pl gr 0,normal ovaries bilat,svp of fluid 5.6 cm,fhr 146 bpm,LVEICF,EFW 396 g 39%,anatomy of the heart complete

## 2018-03-11 ENCOUNTER — Encounter: Payer: Medicaid Other | Admitting: Obstetrics and Gynecology

## 2018-03-17 ENCOUNTER — Encounter: Payer: Medicaid Other | Admitting: Women's Health

## 2018-03-22 ENCOUNTER — Ambulatory Visit (INDEPENDENT_AMBULATORY_CARE_PROVIDER_SITE_OTHER): Payer: Medicaid Other | Admitting: Obstetrics & Gynecology

## 2018-03-22 ENCOUNTER — Encounter: Payer: Self-pay | Admitting: Obstetrics & Gynecology

## 2018-03-22 ENCOUNTER — Encounter (INDEPENDENT_AMBULATORY_CARE_PROVIDER_SITE_OTHER): Payer: Self-pay

## 2018-03-22 VITALS — BP 110/64 | HR 106 | Wt 134.6 lb

## 2018-03-22 DIAGNOSIS — N760 Acute vaginitis: Secondary | ICD-10-CM

## 2018-03-22 DIAGNOSIS — B9689 Other specified bacterial agents as the cause of diseases classified elsewhere: Secondary | ICD-10-CM

## 2018-03-22 DIAGNOSIS — Z3A23 23 weeks gestation of pregnancy: Secondary | ICD-10-CM

## 2018-03-22 DIAGNOSIS — Z3482 Encounter for supervision of other normal pregnancy, second trimester: Secondary | ICD-10-CM

## 2018-03-22 DIAGNOSIS — Z331 Pregnant state, incidental: Secondary | ICD-10-CM

## 2018-03-22 DIAGNOSIS — O23592 Infection of other part of genital tract in pregnancy, second trimester: Secondary | ICD-10-CM

## 2018-03-22 DIAGNOSIS — Z1389 Encounter for screening for other disorder: Secondary | ICD-10-CM

## 2018-03-22 LAB — POCT URINALYSIS DIPSTICK
GLUCOSE UA: NEGATIVE
Ketones, UA: NEGATIVE
Nitrite, UA: NEGATIVE
Protein, UA: NEGATIVE

## 2018-03-22 MED ORDER — METRONIDAZOLE 0.75 % VA GEL: VAGINAL | 0 refills | Status: DC

## 2018-03-22 NOTE — Progress Notes (Signed)
White vaginal with "fish" smell.

## 2018-03-22 NOTE — Progress Notes (Signed)
Patient ID: Ana Manning, female   DOB: 1986-07-19, 32 y.o.   MRN: [id number] 3237w2d Estimated Date of Delivery: 07/17/18  Blood pressure 110/64, pulse (!) 106, weight 134 lb 9.6 oz (61.1 kg), last menstrual period 09/12/2017, unknown if currently breastfeeding.   BP weight and urine results all reviewed and noted.  Please refer to the obstetrical flow sheet for the fundal height and fetal heart rate documentation:  Patient reports good fetal movement, denies any bleeding and no rupture of membranes symptoms or regular contractions. Patient is without complaints. All questions were answered.  Orders Placed This Encounter  Procedures  . POCT urinalysis dipstick    Plan:  Continued routine obstetrical care, wet prep +BV  Meds ordered this encounter  Medications  . metroNIDAZOLE (METROGEL VAGINAL) 0.75 % vaginal gel    Sig: Nightly x 5 nights    Dispense:  70 g    Refill:  0     Return in about 1 month (around 04/19/2018) for LROB.

## 2018-03-31 ENCOUNTER — Telehealth: Payer: Self-pay | Admitting: Obstetrics & Gynecology

## 2018-03-31 NOTE — Telephone Encounter (Signed)
Pt called to speak to someone because she had been having contractions since last night. She is only 24 weeks, and they have been getting closer together. Spoke to [employer] and [facility]. Pt said "I can't go all the way to [facility]. Just forget it. Can you just send my records back to the health department." I told the patient that we would need to have a release form on file in order to send records back, but if that's what she wanted it wouldn't be a problem. Pt then disconnected. 03/31/18 JM

## 2018-04-19 ENCOUNTER — Encounter: Payer: Medicaid Other | Admitting: Advanced Practice Midwife

## 2018-04-19 ENCOUNTER — Other Ambulatory Visit: Payer: Medicaid Other

## 2018-04-25 ENCOUNTER — Encounter: Payer: Medicaid Other | Admitting: Women's Health

## 2018-04-25 ENCOUNTER — Other Ambulatory Visit: Payer: Medicaid Other

## 2018-05-04 ENCOUNTER — Encounter: Payer: Self-pay | Admitting: Advanced Practice Midwife

## 2018-05-04 ENCOUNTER — Other Ambulatory Visit: Payer: Medicaid Other

## 2018-05-04 ENCOUNTER — Encounter: Payer: Medicaid Other | Admitting: Advanced Practice Midwife

## 2018-05-05 ENCOUNTER — Encounter: Payer: Medicaid Other | Admitting: Advanced Practice Midwife

## 2018-05-05 ENCOUNTER — Other Ambulatory Visit: Payer: Medicaid Other

## 2018-06-01 ENCOUNTER — Encounter: Payer: Self-pay | Admitting: Obstetrics and Gynecology

## 2018-06-01 ENCOUNTER — Ambulatory Visit (INDEPENDENT_AMBULATORY_CARE_PROVIDER_SITE_OTHER): Payer: Medicaid Other | Admitting: Obstetrics and Gynecology

## 2018-06-01 VITALS — BP 119/81 | HR 110 | Wt 135.0 lb

## 2018-06-01 DIAGNOSIS — Z3A33 33 weeks gestation of pregnancy: Secondary | ICD-10-CM

## 2018-06-01 DIAGNOSIS — Z1389 Encounter for screening for other disorder: Secondary | ICD-10-CM

## 2018-06-01 DIAGNOSIS — Z3483 Encounter for supervision of other normal pregnancy, third trimester: Secondary | ICD-10-CM

## 2018-06-01 DIAGNOSIS — Z331 Pregnant state, incidental: Secondary | ICD-10-CM

## 2018-06-01 LAB — POCT URINALYSIS DIPSTICK
Glucose, UA: POSITIVE — AB
Ketones, UA: NEGATIVE
LEUKOCYTES UA: NEGATIVE
NITRITE UA: NEGATIVE
Protein, UA: POSITIVE — AB
RBC UA: NEGATIVE

## 2018-06-01 NOTE — Progress Notes (Signed)
Patient ID: Ana Manning Freedman, female   DOB: 11/14/1986, 32 y.o.   MRN: [id number]   LOW-RISK PREGNANCY VISIT Patient name: Ana Manning Hadlock MRN [id number]  Date of birth: 11/14/1986 Chief Complaint:   Routine Prenatal Visit  History of Present Illness:   Ana Manning Rispoli is a 32 y.o. 464P3003 female at 4843w3d with an Estimated Date of Delivery: 07/17/18 being seen today for ongoing management of a low-risk pregnancy.  Today she reports no complaints. She missed her sugar test because she did not have transportation. She reports that she will have a ride next time. She plans on getting her tubes tied after giving birth.  Contractions: Irregular. Vag. Bleeding: None.  Movement: Present. denies leaking of fluid. Review of Systems:   Pertinent items are noted in HPI Denies abnormal vaginal discharge w/ itching/odor/irritation, headaches, visual changes, shortness of breath, chest pain, abdominal pain, severe nausea/vomiting, or problems with urination or bowel movements unless otherwise stated above. Pertinent History Reviewed:  Reviewed past medical,surgical, social, obstetrical and family history.  Reviewed problem list, medications and allergies. Physical Assessment:   Vitals:   06/01/18 0925  BP: 119/81  Pulse: (!) 110  Weight: 135 lb (61.2 kg)  Body mass index is 25.09 kg/m.        Physical Examination:   General appearance: Well appearing, and in no distress  Mental status: Alert, oriented to person, place, and time  Skin: Warm & dry  Cardiovascular: Normal heart rate noted  Respiratory: Normal respiratory effort, no distress  Abdomen: Soft, gravid, nontender  Pelvic: Cervical exam deferred         Extremities: Edema: None  Fetal Status: Fetal Heart Rate (bpm): 126 Fundal Height: 31 cm Movement: Present    Results for orders placed or performed in visit on 06/01/18 (from the past 24 hour(s))  POCT urinalysis dipstick   Collection Time: 06/01/18  9:26 AM  Result Value Ref Range   Color, UA      Clarity, UA     Glucose, UA Positive (A) Negative   Bilirubin, UA     Ketones, UA neg    Spec Grav, UA  1.010 - 1.025   Blood, UA neg    pH, UA  5.0 - 8.0   Protein, UA Positive (A) Negative   Urobilinogen, UA  0.2 or 1.0 E.U./dL   Nitrite, UA neg    Leukocytes, UA Negative Negative   Appearance     Odor      Assessment & Plan:  1) Low-risk pregnancy G4P3003 at 2743w3d with an Estimated Date of Delivery: 07/17/18   2) BTL, papers signed  3) Given papers for PN2   Meds: No orders of the defined types were placed in this encounter.  Labs/procedures today:   Plan:  Continue routine obstetrical care   Follow-up: Return in about 2 weeks (around 06/15/2018) for LROB.  Orders Placed This Encounter  Procedures  . POCT urinalysis dipstick   By signing my name below, I, Diona BrownerJennifer Gorman, attest that this documentation has been prepared under the direction and in the presence of Tilda BurrowFerguson, Blakely Maranan V, MD. Electronically Signed: Diona BrownerJennifer Gorman, Medical Scribe. 06/01/18. 9:51 AM.  I personally performed the services described in this documentation, which was SCRIBED in my presence. The recorded information has been reviewed and considered accurate. It has been edited as necessary during review. Tilda BurrowJohn V Seiji Wiswell, MD

## 2018-06-06 ENCOUNTER — Other Ambulatory Visit: Payer: Medicaid Other

## 2018-06-15 ENCOUNTER — Encounter: Payer: Medicaid Other | Admitting: Advanced Practice Midwife

## 2018-06-23 ENCOUNTER — Ambulatory Visit (INDEPENDENT_AMBULATORY_CARE_PROVIDER_SITE_OTHER): Payer: Medicaid Other | Admitting: Advanced Practice Midwife

## 2018-06-23 ENCOUNTER — Other Ambulatory Visit: Payer: Self-pay

## 2018-06-23 ENCOUNTER — Other Ambulatory Visit: Payer: Medicaid Other

## 2018-06-23 VITALS — BP 127/84 | HR 115 | Wt 138.0 lb

## 2018-06-23 DIAGNOSIS — Z331 Pregnant state, incidental: Secondary | ICD-10-CM

## 2018-06-23 DIAGNOSIS — Z3A36 36 weeks gestation of pregnancy: Secondary | ICD-10-CM

## 2018-06-23 DIAGNOSIS — Z3483 Encounter for supervision of other normal pregnancy, third trimester: Secondary | ICD-10-CM

## 2018-06-23 DIAGNOSIS — O26843 Uterine size-date discrepancy, third trimester: Secondary | ICD-10-CM

## 2018-06-23 DIAGNOSIS — Z1389 Encounter for screening for other disorder: Secondary | ICD-10-CM

## 2018-06-23 LAB — POCT URINALYSIS DIPSTICK
Glucose, UA: NEGATIVE
Ketones, UA: NEGATIVE
NITRITE UA: NEGATIVE
PROTEIN UA: POSITIVE — AB
RBC UA: NEGATIVE

## 2018-06-23 LAB — GLUCOSE, POCT (MANUAL RESULT ENTRY): POC GLUCOSE: 77 mg/dL (ref 70–99)

## 2018-06-23 NOTE — Progress Notes (Signed)
  [id number] 821w4d Estimated Date of Delivery: 07/17/18  Blood pressure 127/84, pulse (!) 115, weight 138 lb (62.6 kg), last menstrual period 09/12/2017, unknown if currently breastfeeding.   BP weight and urine results all reviewed and noted.  Please refer to the obstetrical flow sheet for the fundal height and fetal heart rate documentation:Size<dates  Patient reports good fetal movement, denies any bleeding and no rupture of membranes symptoms or regular contractions. Patient refused 1 hour gtt today.  Plans TOLAC, consent signed.  Consented to TDAP, then jumped when the needle went in and called it off.  All questions were answered.   Physical Assessment:   Vitals:   06/23/18 0915  BP: 127/84  Pulse: (!) 115  Weight: 138 lb (62.6 kg)  Body mass index is 25.65 kg/m.        Physical Examination:   General appearance: Well appearing, and in no distress  Mental status: Alert, oriented to person, place, and time  Skin: Warm & dry  Cardiovascular: Normal heart rate noted  Respiratory: Normal respiratory effort, no distress  Abdomen: Soft, gravid, nontender  Pelvic: Cervical exam performed  Dilation: 1 Effacement (%): Thick Station: -2  Extremities: Edema: None  Fetal Status: Fetal Heart Rate (bpm): 149 Fundal Height: 35 cm Movement: Present Presentation: Vertex  Results for orders placed or performed in visit on 06/23/18 (from the past 24 hour(s))  POCT urinalysis dipstick   Collection Time: 06/23/18  9:19 AM  Result Value Ref Range   Color, UA     Clarity, UA     Glucose, UA Negative Negative   Bilirubin, UA     Ketones, UA neg    Spec Grav, UA  1.010 - 1.025   Blood, UA neg    pH, UA  5.0 - 8.0   Protein, UA Positive (A) Negative   Urobilinogen, UA  0.2 or 1.0 E.U./dL   Nitrite, UA neg    Leukocytes, UA Moderate (2+) (A) Negative   Appearance     Odor    POCT glucose (manual entry)   Collection Time: 06/23/18  9:20 AM  Result Value Ref Range   POC Glucose 77 70 - 99  mg/dl     Orders Placed This Encounter  Procedures  . GC/Chlamydia Probe Amp  . Culture, beta strep (group b only)  . US OB Follow Up  . POCT urinalysis dipstick  . POCT glucose (manual entry)    Plan:  Continued routine obstetrical care,   Return in about 1 week (around 06/30/2018) for LROB, 1 hour GTT and EFW , sign BTL form (already signed 6/19 but wasn't printed).

## 2018-06-23 NOTE — Patient Instructions (Signed)
[address] [place], [place]  AM I IN LABOR? What is labor? Labor is the work that your body does to birth your baby. Your uterus (the womb) contracts. Your cervix (the mouth of the uterus) opens. You will push your baby out into the world.  What do contractions (labor pains) feel like? When they first start, contractions usually feel like cramps during your period. Sometimes you feel pain in your back. Most often, contractions feel like muscles pulling painfully in your lower belly. At first, the contractions will probably be 15 to 20 minutes apart. They will not feel too painful. As labor goes on, the contractions get stronger, closer together, and more painful.  How do I time the contractions? Time your contractions by counting the number of minutes from the start of one contraction to the start of the next contraction.  What should I do when the contractions start? If it is night and you can sleep, sleep. If it happens during the day, here are some things you can do to take care of yourself at home: ? Walk. If the pains you are having are real labor, walking will make the contractions come faster and harder. If the contractions are not going to continue and be real labor, walking will make the contractions slow down. ? Take a shower or bath. This will help you relax. ? Eat. Labor is a big event. It takes a lot of energy. ? Drink water. Not drinking enough water can cause false labor (contractions that hurt but do not open your cervix). If this is true labor, drinking water will help you have strength to get through your labor. ? Take a nap. Get all the rest you can. ? Get a massage. If your labor is in your back, a strong massage on your lower back may feel very good. Getting a foot massage is always good. ? Don't panic. You can do this. Your body was made for this. You are strong!  When should I go to the hospital or call my health care provider? ? Your contractions have been 5  minutes apart or less for at least 1 hour. ? If several contractions are so painful you cannot walk or talk during one. ? Your bag of waters breaks. (You may have a big gush of water or just water that runs down your legs when you walk.)  Are there other reasons to call my health care provider? Yes, you should call your health care provider or go to the hospital if you start to bleed like you are having a period- blood that soaks your underwear or runs down your legs, if you have sudden severe pain, if your baby has not moved for several hours, or if you are leaking green fluid. The rule is as follows: If you are very concerned about something, call.

## 2018-06-24 ENCOUNTER — Telehealth: Payer: Self-pay | Admitting: *Deleted

## 2018-06-24 LAB — CBC
HEMOGLOBIN: 11.9 g/dL (ref 11.1–15.9)
Hematocrit: 35.3 % (ref 34.0–46.6)
MCH: 32.8 pg (ref 26.6–33.0)
MCHC: 33.7 g/dL (ref 31.5–35.7)
MCV: 97 fL (ref 79–97)
Platelets: 195 10*3/uL (ref 150–450)
RBC: 3.63 x10E6/uL — ABNORMAL LOW (ref 3.77–5.28)
RDW: 13.9 % (ref 12.3–15.4)
WBC: 8.6 10*3/uL (ref 3.4–10.8)

## 2018-06-24 LAB — ANTIBODY SCREEN: Antibody Screen: NEGATIVE

## 2018-06-24 LAB — HEPATITIS B SURFACE ANTIGEN: Hepatitis B Surface Ag: NEGATIVE

## 2018-06-24 LAB — HIV ANTIBODY (ROUTINE TESTING W REFLEX): HIV Screen 4th Generation wRfx: NONREACTIVE

## 2018-06-24 LAB — RPR: RPR Ser Ql: NONREACTIVE

## 2018-06-24 NOTE — Telephone Encounter (Signed)
Pt scheduled for U/S for [facility] at [facility] on 7/18 at 11:30.  Pt to arrive at 11:15.

## 2018-06-25 LAB — GC/CHLAMYDIA PROBE AMP
Chlamydia trachomatis, NAA: NEGATIVE
NEISSERIA GONORRHOEAE BY PCR: NEGATIVE

## 2018-06-27 LAB — CULTURE, BETA STREP (GROUP B ONLY): STREP GP B CULTURE: POSITIVE — AB

## 2018-06-30 ENCOUNTER — Telehealth: Payer: Self-pay | Admitting: *Deleted

## 2018-06-30 ENCOUNTER — Inpatient Hospital Stay
Admission: EM | Admit: 2018-06-30 | Discharge: 2018-06-30 | Disposition: A | Discharge status: Home / Self Care | Payer: Medicaid Other | Attending: Obstetrics and Gynecology | Admitting: Obstetrics and Gynecology

## 2018-06-30 ENCOUNTER — Ambulatory Visit (HOSPITAL_COMMUNITY)
Admission: RE | Admit: 2018-06-30 | Discharge: 2018-06-30 | Disposition: A | Discharge status: Home / Self Care | Payer: Medicaid Other | Source: Ambulatory Visit | Attending: Advanced Practice Midwife | Admitting: Advanced Practice Midwife

## 2018-06-30 ENCOUNTER — Ambulatory Visit (INDEPENDENT_AMBULATORY_CARE_PROVIDER_SITE_OTHER): Payer: Medicaid Other | Admitting: Women's Health

## 2018-06-30 VITALS — BP 117/69 | HR 99 | Wt 136.0 lb

## 2018-06-30 DIAGNOSIS — Z3483 Encounter for supervision of other normal pregnancy, third trimester: Secondary | ICD-10-CM

## 2018-06-30 DIAGNOSIS — Z3A37 37 weeks gestation of pregnancy: Secondary | ICD-10-CM

## 2018-06-30 DIAGNOSIS — O99333 Smoking (tobacco) complicating pregnancy, third trimester: Secondary | ICD-10-CM | POA: Diagnosis not present

## 2018-06-30 DIAGNOSIS — F17210 Nicotine dependence, cigarettes, uncomplicated: Secondary | ICD-10-CM | POA: Insufficient documentation

## 2018-06-30 DIAGNOSIS — O26843 Uterine size-date discrepancy, third trimester: Secondary | ICD-10-CM | POA: Diagnosis present

## 2018-06-30 DIAGNOSIS — Z3A34 34 weeks gestation of pregnancy: Secondary | ICD-10-CM | POA: Insufficient documentation

## 2018-06-30 DIAGNOSIS — Z98891 History of uterine scar from previous surgery: Secondary | ICD-10-CM

## 2018-06-30 DIAGNOSIS — F1290 Cannabis use, unspecified, uncomplicated: Secondary | ICD-10-CM

## 2018-06-30 DIAGNOSIS — O0932 Supervision of pregnancy with insufficient antenatal care, second trimester: Secondary | ICD-10-CM

## 2018-06-30 DIAGNOSIS — O09899 Supervision of other high risk pregnancies, unspecified trimester: Secondary | ICD-10-CM

## 2018-06-30 NOTE — Patient Instructions (Signed)
Ana Manning, I greatly value your feedback.  If you receive a survey following your visit with us today, we appreciate you taking the time to fill it out.  Thanks, Joellyn HaffKim Cadarius Nevares, CNM, WHNP-BC   Call the office [phone]) or go to [facility] if:  You begin to have strong, frequent contractions  Your water breaks.  Sometimes it is a big gush of fluid, sometimes it is just a trickle that keeps getting your panties wet or running down your legs  You have vaginal bleeding.  It is normal to have a small amount of spotting if your cervix was checked.   You don't feel your baby moving like normal.  If you don't, get you something to eat and drink and lay down and focus on feeling your baby move.  You should feel at least 10 movements in 2 hours.  If you don't, you should call the office or go to [facility].     [facility] Contractions of the uterus can occur throughout pregnancy, but they are not always a sign that you are in labor. You may have practice contractions called Braxton Hicks contractions. These false labor contractions are sometimes confused with true labor. What are Deberah PeltonBraxton Hicks contractions? Braxton Hicks contractions are tightening movements that occur in the muscles of the uterus before labor. Unlike true labor contractions, these contractions do not result in opening (dilation) and thinning of the cervix. Toward the end of pregnancy (32-34 weeks), Braxton Hicks contractions can happen more often and may become stronger. These contractions are sometimes difficult to tell apart from true labor because they can be very uncomfortable. You should not feel embarrassed if you go to the hospital with false labor. Sometimes, the only way to tell if you are in true labor is for your health care provider to look for changes in the cervix. The health care provider will do a physical exam and may monitor your contractions. If you are not in true labor, the exam should show  that your cervix is not dilating and your water has not broken. If there are other health problems associated with your pregnancy, it is completely safe for you to be sent home with false labor. You may continue to have Braxton Hicks contractions until you go into true labor. How to tell the difference between true labor and false labor True labor  Contractions last 30-70 seconds.  Contractions become very regular.  Discomfort is usually felt in the top of the uterus, and it spreads to the lower abdomen and low back.  Contractions do not go away with walking.  Contractions usually become more intense and increase in frequency.  The cervix dilates and gets thinner. False labor  Contractions are usually shorter and not as strong as true labor contractions.  Contractions are usually irregular.  Contractions are often felt in the front of the lower abdomen and in the groin.  Contractions may go away when you walk around or change positions while lying down.  Contractions get weaker and are shorter-lasting as time goes on.  The cervix usually does not dilate or become thin. Follow these instructions at home:  Take over-the-counter and prescription medicines only as told by your health care provider.  Keep up with your usual exercises and follow other instructions from your health care provider.  Eat and drink lightly if you think you are going into labor.  If Braxton Hicks contractions are making you uncomfortable: ? Change your position from lying down  or resting to walking, or change from walking to resting. ? Sit and rest in a tub of warm water. ? Drink enough fluid to keep your urine pale yellow. Dehydration may cause these contractions. ? Do slow and deep breathing several times an hour.  Keep all follow-up prenatal visits as told by your health care provider. This is important. Contact a health care provider if:  You have a fever.  You have continuous pain in your  abdomen. Get help right away if:  Your contractions become stronger, more regular, and closer together.  You have fluid leaking or gushing from your vagina.  You pass blood-tinged mucus (bloody show).  You have bleeding from your vagina.  You have low back pain that you never had before.  You feel your baby's head pushing down and causing pelvic pressure.  Your baby is not moving inside you as much as it used to. Summary  Contractions that occur before labor are called Braxton Hicks contractions, false labor, or practice contractions.  Braxton Hicks contractions are usually shorter, weaker, farther apart, and less regular than true labor contractions. True labor contractions usually become progressively stronger and regular and they become more frequent.  Manage discomfort from [facility] contractions by changing position, resting in a warm bath, drinking plenty of water, or practicing deep breathing. This information is not intended to replace advice given to you by your health care provider. Make sure you discuss any questions you have with your health care provider. Document Released: 04/15/2017 Document Revised: 04/15/2017 Document Reviewed: 04/15/2017 Elsevier Interactive Patient Education  2018 [employer].

## 2018-06-30 NOTE — Telephone Encounter (Signed)
Pt called and stated that she has been having regular contractions for over 2 hours. They are less than five minutes apart and very painful. Baby is moving well. Advised patient that she would need to go to [facility] to be evaluated. Patient stated that she doesn't want to go anywhere right now. Advised that she can monitor her symptoms but if they continue to be regular she should go in. Patient agreeable.

## 2018-06-30 NOTE — Progress Notes (Signed)
   LOW-RISK PREGNANCY VISIT Patient name: Ana MarusSamara Surles MRN [id number]  Date of birth: June 08, 1986 Chief Complaint:   Routine Prenatal Visit  History of Present Illness:   Ana Manning is a 32 y.o. 184P3003 female at 4833w4d with an Estimated Date of Delivery: 07/17/18 being seen today for ongoing management of a low-risk pregnancy.  Today she reports uc's q 10mins. Contractions: Irregular. Vag. Bleeding: None.  Movement: Present. denies leaking of fluid. Review of Systems:   Pertinent items are noted in HPI Denies abnormal vaginal discharge w/ itching/odor/irritation, headaches, visual changes, shortness of breath, chest pain, abdominal pain, severe nausea/vomiting, or problems with urination or bowel movements unless otherwise stated above. Pertinent History Reviewed:  Reviewed past medical,surgical, social, obstetrical and family history.  Reviewed problem list, medications and allergies. Physical Assessment:   Vitals:   06/30/18 1200  BP: 117/69  Pulse: 99  Weight: 136 lb (61.7 kg)  Body mass index is 25.28 kg/m.        Physical Examination:   General appearance: Well appearing, and in no distress  Mental status: Alert, oriented to person, place, and time  Skin: Warm & dry  Cardiovascular: Normal heart rate noted  Respiratory: Normal respiratory effort, no distress  Abdomen: Soft, gravid, nontender  Pelvic: Cervical exam performed  Dilation: 4.5 Effacement (%): Thick Station: -2  Extremities: Edema: None  Fetal Status: Fetal Heart Rate (bpm): 140 Fundal Height: 35 cm Movement: Present Presentation: Vertex  No results found for this or any previous visit (from the past 24 hour(s)).  Assessment & Plan:  1) Low-risk pregnancy G4P3003 at 2833w4d with an Estimated Date of Delivery: 07/17/18    Meds: No orders of the defined types were placed in this encounter.  Labs/procedures today: sve, declined tdap  Plan:  Continue routine obstetrical care   Reviewed: Term labor symptoms and  general obstetric precautions including but not limited to vaginal bleeding, contractions, leaking of fluid and fetal movement were reviewed in detail with the patient.  All questions were answered  Follow-up: Return in about 1 week (around 07/07/2018) for LROB.  No orders of the defined types were placed in this encounter.  Cheral MarkerKimberly R Carrye Goller CNM, [facility]NP-BC 06/30/2018 12:39 PM

## 2018-06-30 NOTE — Progress Notes (Addendum)
Patient ID: Ana Manning, female   DOB: January 31, 1986, 32 y.o.   MRN: [id number]   Ana Manning is a 32 y.o. G46P3003 female at [redacted]w[redacted]d with an Estimated Date of Delivery: 07/17/18 being seen today for UC's q 10 mins, no LOF, no VB or decreased FM.  Pt was referred by [facility] to [facility] practice. On 06/23/18 it is noted in the medical records of which are incomplete, that the pt signed a 06/23/18 form for a VBAC with [facility].  Past Medical History:  Diagnosis Date  . Medical history non-contributory    Past Surgical History:  Procedure Laterality Date  . shoulder sugery Right    Family History  Problem Relation Age of Onset  . Multiple births Maternal Grandmother   . Lupus Maternal Grandfather   . ADD / ADHD Son   . ADD / ADHD Son   . Lupus Father    Social History   Socioeconomic History  . Marital status: Single    Spouse name: Not on file  . Number of children: Not on file  . Years of education: Not on file  . Highest education level: Not on file  Occupational History  . Not on file  Social Needs  . Financial resource strain: Not on file  . Food insecurity:    Worry: Not on file    Inability: Not on file  . Transportation needs:    Medical: Not on file    Non-medical: Not on file  Tobacco Use  . Smoking status: Current Every Day Smoker    Packs/day: 0.50    Types: Cigarettes  . Smokeless tobacco: Never Used  Substance and Sexual Activity  . Alcohol use: No  . Drug use: Yes    Types: Marijuana  . Sexual activity: Yes    Birth control/protection: None  Lifestyle  . Physical activity:    Days per week: Not on file    Minutes per session: Not on file  . Stress: Not on file  Relationships  . Social connections:    Talks on phone: Not on file    Gets together: Not on file    Attends religious service: Not on file    Active member of club or organization: Not on file    Attends meetings of clubs or organizations: Not on file    Relationship  status: Not on file  . Intimate partner violence:    Fear of current or ex partner: Not on file    Emotionally abused: Not on file    Physically abused: Not on file    Forced sexual activity: Not on file  Other Topics Concern  . Not on file  Social History Narrative  . Not on file  Gen:.HEENT Gen:A,A&Ox3 HEENT: Normocephalic, Eyes non-icteric. HEART:Pulse reg. LUNGS:CTA bilat, no W/R/R ABD: Gravid. Extrems:warm, dry, NT, Neg Homan's VE:4.5-5cms (pt was 4.5 cms in office earlier today), no LOF, NO VB, Cx is very posterior and difficult to reach. Vtx-3 Psych: As soon as I addressed that pt could not be delivered today by IOL, pt became upset and stating she would not be able to get her BTL as she would have to go to [facility] and they will not do it as she has [place] [employer]. She started pulling her gown off and stating, I am leaving and going to [facility] so they can do something. I cannot leave and have this baby at home. A:1. IUP at 37 4/7 weeks 2. TOLAC preferred by  pt 3. LTCS with first baby and 2 successful babies by VBAC 4. [facility] P:1. Due to our rule of non-induction of TOLAC's, explained to pt that she would have to progress further to be declared as a laboring pt. At this time, cannot do ROM due to the risk of pre-term delivery. Pt has not scheduled her C-section/TL yet and discussed that [facility]. [facility]. 2. Recommended that pt walk and stay for 2 hours to see if labor increases but, [facility] declined [facility]. Advised pt to go to the practice and hospital where she was consented for a TOLAC. _____________________________________ Myrtie Cruisearon W. Jones,RN, MSN, CNM, FNP Certified Nurse Midwife [facility]

## 2018-07-01 ENCOUNTER — Telehealth: Payer: Self-pay | Admitting: *Deleted

## 2018-07-01 NOTE — Telephone Encounter (Signed)
Patient states [facility] and was discharged home.  Says she was contracting about every 3 minutes and was still contracting during the night but is not timing them now because they are not as close.  Wants to be seen [facility]  I informed her that if her contractions had spaced out then more than likely she was not in labor.  I would be glad to work her in for a cervical check but if her cervix was unchanged, she would be sent back home.  Patient stated "yall just want me to have a baby at home or to [facility] at 8[facility] and not be able to get any pain medication."  Informed patient that was not our intentions but it could happen at any time.  Explained the difference between induction vs labor as patient stated she was told her labor would not be stopped since she is 37 weeks.  Patient asked to speak with Dr Emelda FearFerguson but informed he was out of the office and even if she did see him, he would follow the same criteria as everyone else.  Pt very frustrated and stated she was not coming in just for us to send her home and hung up.

## 2018-07-04 ENCOUNTER — Inpatient Hospital Stay (HOSPITAL_COMMUNITY): Payer: Medicaid Other | Admitting: Anesthesiology

## 2018-07-04 ENCOUNTER — Ambulatory Visit (INDEPENDENT_AMBULATORY_CARE_PROVIDER_SITE_OTHER): Payer: Medicaid Other | Admitting: Women's Health

## 2018-07-04 ENCOUNTER — Other Ambulatory Visit: Payer: Self-pay | Admitting: Family Medicine

## 2018-07-04 ENCOUNTER — Encounter (HOSPITAL_COMMUNITY): Payer: Self-pay

## 2018-07-04 ENCOUNTER — Other Ambulatory Visit: Payer: Self-pay

## 2018-07-04 ENCOUNTER — Telehealth: Payer: Self-pay | Admitting: *Deleted

## 2018-07-04 ENCOUNTER — Inpatient Hospital Stay (HOSPITAL_COMMUNITY)
Admission: AD | Admit: 2018-07-04 | Discharge: 2018-07-06 | DRG: 798 | Disposition: A | Discharge status: Home / Self Care | Payer: Medicaid Other | Attending: Obstetrics and Gynecology | Admitting: Obstetrics and Gynecology

## 2018-07-04 VITALS — BP 160/90 | HR 102 | Wt 135.0 lb

## 2018-07-04 DIAGNOSIS — O134 Gestational [pregnancy-induced] hypertension without significant proteinuria, complicating childbirth: Secondary | ICD-10-CM | POA: Diagnosis present

## 2018-07-04 DIAGNOSIS — Z3A38 38 weeks gestation of pregnancy: Secondary | ICD-10-CM

## 2018-07-04 DIAGNOSIS — O99824 Streptococcus B carrier state complicating childbirth: Secondary | ICD-10-CM | POA: Diagnosis present

## 2018-07-04 DIAGNOSIS — Z1389 Encounter for screening for other disorder: Secondary | ICD-10-CM

## 2018-07-04 DIAGNOSIS — O139 Gestational [pregnancy-induced] hypertension without significant proteinuria, unspecified trimester: Secondary | ICD-10-CM | POA: Diagnosis not present

## 2018-07-04 DIAGNOSIS — F17210 Nicotine dependence, cigarettes, uncomplicated: Secondary | ICD-10-CM | POA: Diagnosis present

## 2018-07-04 DIAGNOSIS — O26893 Other specified pregnancy related conditions, third trimester: Secondary | ICD-10-CM | POA: Diagnosis present

## 2018-07-04 DIAGNOSIS — Z6791 Unspecified blood type, Rh negative: Secondary | ICD-10-CM | POA: Diagnosis not present

## 2018-07-04 DIAGNOSIS — O0932 Supervision of pregnancy with insufficient antenatal care, second trimester: Secondary | ICD-10-CM

## 2018-07-04 DIAGNOSIS — O99324 Drug use complicating childbirth: Secondary | ICD-10-CM | POA: Diagnosis present

## 2018-07-04 DIAGNOSIS — Z3483 Encounter for supervision of other normal pregnancy, third trimester: Secondary | ICD-10-CM

## 2018-07-04 DIAGNOSIS — Z3490 Encounter for supervision of normal pregnancy, unspecified, unspecified trimester: Secondary | ICD-10-CM

## 2018-07-04 DIAGNOSIS — O34219 Maternal care for unspecified type scar from previous cesarean delivery: Secondary | ICD-10-CM | POA: Diagnosis present

## 2018-07-04 DIAGNOSIS — Z302 Encounter for sterilization: Secondary | ICD-10-CM | POA: Diagnosis not present

## 2018-07-04 DIAGNOSIS — R030 Elevated blood-pressure reading, without diagnosis of hypertension: Secondary | ICD-10-CM

## 2018-07-04 DIAGNOSIS — F1290 Cannabis use, unspecified, uncomplicated: Secondary | ICD-10-CM | POA: Diagnosis present

## 2018-07-04 DIAGNOSIS — Z331 Pregnant state, incidental: Secondary | ICD-10-CM

## 2018-07-04 DIAGNOSIS — O09899 Supervision of other high risk pregnancies, unspecified trimester: Secondary | ICD-10-CM

## 2018-07-04 DIAGNOSIS — Z98891 History of uterine scar from previous surgery: Secondary | ICD-10-CM

## 2018-07-04 DIAGNOSIS — O99334 Smoking (tobacco) complicating childbirth: Secondary | ICD-10-CM | POA: Diagnosis present

## 2018-07-04 DIAGNOSIS — O26899 Other specified pregnancy related conditions, unspecified trimester: Secondary | ICD-10-CM

## 2018-07-04 DIAGNOSIS — O9989 Other specified diseases and conditions complicating pregnancy, childbirth and the puerperium: Secondary | ICD-10-CM

## 2018-07-04 LAB — COMPREHENSIVE METABOLIC PANEL WITH GFR
ALT: 46 U/L — ABNORMAL HIGH (ref 0–44)
AST: 41 U/L (ref 15–41)
Albumin: 3.6 g/dL (ref 3.5–5.0)
Alkaline Phosphatase: 299 U/L — ABNORMAL HIGH (ref 38–126)
Anion gap: 12 (ref 5–15)
BUN: 6 mg/dL (ref 6–20)
CO2: 20 mmol/L — ABNORMAL LOW (ref 22–32)
Calcium: 8.4 mg/dL — ABNORMAL LOW (ref 8.9–10.3)
Chloride: 102 mmol/L (ref 98–111)
Creatinine, Ser: 0.64 mg/dL (ref 0.44–1.00)
GFR calc Af Amer: >60 mL/min
GFR calc non Af Amer: >60 mL/min
Glucose, Bld: 73 mg/dL (ref 70–99)
Potassium: 3.3 mmol/L — ABNORMAL LOW (ref 3.5–5.1)
Sodium: 134 mmol/L — ABNORMAL LOW (ref 135–145)
Total Bilirubin: 0.9 mg/dL (ref 0.3–1.2)
Total Protein: 7.3 g/dL (ref 6.5–8.1)

## 2018-07-04 LAB — PROTEIN / CREATININE RATIO, URINE
CREATININE, URINE: 70 mg/dL
Protein Creatinine Ratio: 0.31 mg/mg{Cre} — ABNORMAL HIGH (ref 0.00–0.15)
Total Protein, Urine: 22 mg/dL

## 2018-07-04 LAB — POCT URINALYSIS DIPSTICK OB
Glucose, UA: NEGATIVE — AB
LEUKOCYTES UA: NEGATIVE
NITRITE UA: NEGATIVE
PROTEIN: NEGATIVE
RBC UA: NEGATIVE

## 2018-07-04 LAB — CBC
HCT: 35.6 % — ABNORMAL LOW (ref 36.0–46.0)
Hemoglobin: 12 g/dL (ref 12.0–15.0)
MCH: 33.1 pg (ref 26.0–34.0)
MCHC: 33.7 g/dL (ref 30.0–36.0)
MCV: 98.3 fL (ref 78.0–100.0)
Platelets: 189 K/uL (ref 150–400)
RBC: 3.62 MIL/uL — ABNORMAL LOW (ref 3.87–5.11)
RDW: 14.1 % (ref 11.5–15.5)
WBC: 11.7 K/uL — ABNORMAL HIGH (ref 4.0–10.5)

## 2018-07-04 LAB — RAPID URINE DRUG SCREEN, HOSP PERFORMED
Amphetamines: NOT DETECTED
BARBITURATES: NOT DETECTED
Benzodiazepines: NOT DETECTED
COCAINE: NOT DETECTED
Opiates: NOT DETECTED
Tetrahydrocannabinol: POSITIVE — AB

## 2018-07-04 MED ORDER — LIDOCAINE HCL (PF) 1 % IJ SOLN
INTRAMUSCULAR | Status: DC | PRN
  Administered 2018-07-04: 4 mL via EPIDURAL
  Administered 2018-07-04: 5 mL

## 2018-07-04 MED ORDER — OXYTOCIN 40 UNITS IN LACTATED RINGERS INFUSION - SIMPLE MED: 2.5000 [IU]/h | INTRAVENOUS | Status: DC

## 2018-07-04 MED ORDER — FENTANYL 2.5 MCG/ML BUPIVACAINE 1/10 % EPIDURAL INFUSION (WH - ANES)
14.0000 mL/h | INTRAMUSCULAR | Status: DC | PRN
  Filled 2018-07-04: qty 100

## 2018-07-04 MED ORDER — EPHEDRINE 5 MG/ML INJ
10.0000 mg | INTRAVENOUS | Status: DC | PRN
  Filled 2018-07-04: qty 2

## 2018-07-04 MED ORDER — LIDOCAINE HCL (PF) 1 % IJ SOLN
30.0000 mL | INTRAMUSCULAR | Status: DC | PRN
  Filled 2018-07-04: qty 30

## 2018-07-04 MED ORDER — FENTANYL CITRATE (PF) 100 MCG/2ML IJ SOLN: 100.0000 ug | INTRAMUSCULAR | Status: DC | PRN

## 2018-07-04 MED ORDER — IBUPROFEN 600 MG PO TABS
600.0000 mg | ORAL_TABLET | Freq: Four times a day (QID) | ORAL | Status: DC
  Administered 2018-07-04 – 2018-07-06 (×5): 600 mg via ORAL
  Filled 2018-07-04 (×6): qty 1

## 2018-07-04 MED ORDER — FLEET ENEMA 7-19 GM/118ML RE ENEM: 1.0000 | ENEMA | RECTAL | Status: DC | PRN

## 2018-07-04 MED ORDER — DIPHENHYDRAMINE HCL 50 MG/ML IJ SOLN: 12.5000 mg | INTRAMUSCULAR | Status: DC | PRN

## 2018-07-04 MED ORDER — ACETAMINOPHEN 325 MG PO TABS
650.0000 mg | ORAL_TABLET | ORAL | Status: DC | PRN
  Administered 2018-07-05: 650 mg via ORAL
  Filled 2018-07-04: qty 2

## 2018-07-04 MED ORDER — ONDANSETRON HCL 4 MG/2ML IJ SOLN: 4.0000 mg | Freq: Four times a day (QID) | INTRAMUSCULAR | Status: DC | PRN

## 2018-07-04 MED ORDER — LIDOCAINE-EPINEPHRINE 2 %-1:100000 IJ SOLN
INTRAMUSCULAR | Status: DC | PRN
  Administered 2018-07-04: 7 mL via INTRADERMAL

## 2018-07-04 MED ORDER — FENTANYL CITRATE (PF) 100 MCG/2ML IJ SOLN: 50.0000 ug | Freq: Once | INTRAMUSCULAR | Status: DC

## 2018-07-04 MED ORDER — SOD CITRATE-CITRIC ACID 500-334 MG/5ML PO SOLN: 30.0000 mL | ORAL | Status: DC | PRN

## 2018-07-04 MED ORDER — LACTATED RINGERS IV SOLN
INTRAVENOUS | Status: DC
  Administered 2018-07-04: 17:00:00 via INTRAVENOUS

## 2018-07-04 MED ORDER — BENZOCAINE-MENTHOL 20-0.5 % EX AERO: 1.0000 "application " | INHALATION_SPRAY | CUTANEOUS | Status: DC | PRN

## 2018-07-04 MED ORDER — SIMETHICONE 80 MG PO CHEW
80.0000 mg | CHEWABLE_TABLET | ORAL | Status: DC | PRN
  Administered 2018-07-06: 80 mg via ORAL
  Filled 2018-07-04: qty 1

## 2018-07-04 MED ORDER — LACTATED RINGERS IV SOLN: 500.0000 mL | INTRAVENOUS | Status: DC | PRN

## 2018-07-04 MED ORDER — OXYCODONE-ACETAMINOPHEN 5-325 MG PO TABS: 2.0000 | ORAL_TABLET | ORAL | Status: DC | PRN

## 2018-07-04 MED ORDER — PHENYLEPHRINE 40 MCG/ML (10ML) SYRINGE FOR IV PUSH (FOR BLOOD PRESSURE SUPPORT)
80.0000 ug | PREFILLED_SYRINGE | INTRAVENOUS | Status: DC | PRN
  Filled 2018-07-04: qty 5

## 2018-07-04 MED ORDER — LACTATED RINGERS IV SOLN: 500.0000 mL | Freq: Once | INTRAVENOUS | Status: DC

## 2018-07-04 MED ORDER — WITCH HAZEL-GLYCERIN EX PADS: 1.0000 "application " | MEDICATED_PAD | CUTANEOUS | Status: DC | PRN

## 2018-07-04 MED ORDER — KETOROLAC TROMETHAMINE 30 MG/ML IJ SOLN
30.0000 mg | Freq: Four times a day (QID) | INTRAMUSCULAR | Status: AC
  Administered 2018-07-04 – 2018-07-05 (×2): 30 mg via INTRAVENOUS
  Filled 2018-07-04 (×2): qty 1

## 2018-07-04 MED ORDER — ZOLPIDEM TARTRATE 5 MG PO TABS
5.0000 mg | ORAL_TABLET | Freq: Every evening | ORAL | Status: DC | PRN
Start: 2018-07-04 — End: 2018-07-06

## 2018-07-04 MED ORDER — OXYTOCIN BOLUS FROM INFUSION
500.0000 mL | Freq: Once | INTRAVENOUS | Status: AC
  Administered 2018-07-04: 500 mL via INTRAVENOUS

## 2018-07-04 MED ORDER — TETANUS-DIPHTH-ACELL PERTUSSIS 5-2.5-18.5 LF-MCG/0.5 IM SUSP: 0.5000 mL | Freq: Once | INTRAMUSCULAR | Status: DC

## 2018-07-04 MED ORDER — ONDANSETRON HCL 4 MG/2ML IJ SOLN: 4.0000 mg | INTRAMUSCULAR | Status: DC | PRN

## 2018-07-04 MED ORDER — SODIUM CHLORIDE 0.9 % IV SOLN
2.0000 g | Freq: Once | INTRAVENOUS | Status: AC
  Administered 2018-07-04: 2 g via INTRAVENOUS
  Filled 2018-07-04: qty 2

## 2018-07-04 MED ORDER — SENNOSIDES-DOCUSATE SODIUM 8.6-50 MG PO TABS
2.0000 | ORAL_TABLET | ORAL | Status: DC
  Administered 2018-07-04 – 2018-07-06 (×2): 2 via ORAL
  Filled 2018-07-04 (×2): qty 2

## 2018-07-04 MED ORDER — DIPHENHYDRAMINE HCL 25 MG PO CAPS: 25.0000 mg | ORAL_CAPSULE | Freq: Four times a day (QID) | ORAL | Status: DC | PRN

## 2018-07-04 MED ORDER — FENTANYL CITRATE (PF) 100 MCG/2ML IJ SOLN
100.0000 ug | Freq: Once | INTRAMUSCULAR | Status: AC
  Administered 2018-07-04: 100 ug via INTRAVENOUS

## 2018-07-04 MED ORDER — ACETAMINOPHEN 325 MG PO TABS: 650.0000 mg | ORAL_TABLET | ORAL | Status: DC | PRN

## 2018-07-04 MED ORDER — PRENATAL MULTIVITAMIN CH
1.0000 | ORAL_TABLET | Freq: Every day | ORAL | Status: DC
  Administered 2018-07-05 – 2018-07-06 (×2): 1 via ORAL
  Filled 2018-07-04 (×2): qty 1

## 2018-07-04 MED ORDER — OXYCODONE-ACETAMINOPHEN 5-325 MG PO TABS: 1.0000 | ORAL_TABLET | ORAL | Status: DC | PRN

## 2018-07-04 MED ORDER — DIBUCAINE 1 % RE OINT: 1.0000 "application " | TOPICAL_OINTMENT | RECTAL | Status: DC | PRN

## 2018-07-04 MED ORDER — FENTANYL CITRATE (PF) 100 MCG/2ML IJ SOLN
INTRAMUSCULAR | Status: AC
  Filled 2018-07-04: qty 2

## 2018-07-04 MED ORDER — ONDANSETRON HCL 4 MG PO TABS: 4.0000 mg | ORAL_TABLET | ORAL | Status: DC | PRN

## 2018-07-04 MED ORDER — COCONUT OIL OIL: 1.0000 "application " | TOPICAL_OIL | Status: DC | PRN

## 2018-07-04 NOTE — H&P (Signed)
OBSTETRIC ADMISSION HISTORY AND PHYSICAL  Ana Manning is a 32 y.o. female [phone] with IUP at 103w1d by 16-week ultrasound presenting for SOL. Seen in office today and cervix 6 cm there.  Prenatal History/Complications: [facility] Patient Active Problem List   Diagnosis Date Noted  . Normal labor 07/04/2018  . 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 Supervision of normal pregnancy 02/02/2018  . Short interval between pregnancies affecting pregnancy, antepartum 01/20/2018  . Late prenatal care in second trimester 01/20/2018  . History of cesarean delivery 01/20/2018  . History of vaginal delivery following previous cesarean delivery 01/20/2018  . Pregnancy 12/12/2016    Past Medical History: Past Medical History:  Diagnosis Date  . Medical history non-contributory     Past Surgical History: Past Surgical History:  Procedure Laterality Date  . shoulder sugery Right     Obstetrical History: OB History    Gravida  4   Para  3   Term  3   Preterm      AB      Living  3     SAB      TAB      Ectopic      Multiple      Live Births  3           Social History: Social History   Socioeconomic History  . Marital status: Single    Spouse name: Not on file  . Number of children: Not on file  . Years of education: Not on file  . Highest education level: Not on file  Occupational History  . Not on file  Social Needs  . Financial resource strain: Not on file  . Food insecurity:    Worry: Not on file    Inability: Not on file  . Transportation needs:    Medical: Not on file    Non-medical: Not on file  Tobacco Use  . Smoking status: Current Every Day Smoker    Packs/day: 0.50    Types: Cigarettes  . Smokeless tobacco: Never Used  Substance and Sexual Activity  . Alcohol use: No  . Drug use: Yes    Types: Marijuana  . Sexual activity: Yes    Birth control/protection: None  Lifestyle  . Physical activity:    Days per week: Not on file    Minutes  per session: Not on file  . Stress: Not on file  Relationships  . Social connections:    Talks on phone: Not on file    Gets together: Not on file    Attends religious service: Not on file    Active member of club or organization: Not on file    Attends meetings of clubs or organizations: Not on file    Relationship status: Not on file  Other Topics Concern  . Not on file  Social History Narrative  . Not on file    Family History: Family History  Problem Relation Age of Onset  . Multiple births Maternal Grandmother   . Lupus Maternal Grandfather   . ADD / ADHD Son   . ADD / ADHD Son   . Lupus Father     Allergies: Allergies  Allergen Reactions  . Other     Tomatoes     Medications Prior to Admission  Medication Sig Dispense Refill Last Dose  . Prenatal Vit-Fe Fumarate-FA (MULTIVITAMIN-PRENATAL) 27-0.8 MG TABS tablet Take 1 tablet by mouth daily at 12 noon.   Taking     Review  of Systems  All systems reviewed and negative except as stated in HPI  Physical Exam Last menstrual period 09/12/2017, unknown if currently breastfeeding. General appearance: alert, moving around in bed in pain Lungs: no respiratory distress Heart: regular rate  Abdomen: Gravid Extremities: No LE edema SVE: 8.5cm/90%/0   Prenatal labs: ABO, Rh: AB/Positive/-- (01/30 0000) Antibody: Negative (07/11 0907) Rubella: Immune (01/30 0000) RPR: Non Reactive (07/11 0907)  HBsAg: Negative (07/11 0907)  HIV: Non Reactive (07/11 0907)  GBS:   positive 2 hr GTT: n/a Genetic screening  declined Anatomy US normal  Prenatal Transfer Tool  Maternal Diabetes: No Genetic Screening: Declined Maternal Ultrasounds/Referrals: Normal Fetal Ultrasounds or other Referrals:  None Maternal Substance Abuse:  Yes:  Type: Marijuana Significant Maternal Medications:  None Significant Maternal Lab Results: Lab values include: Group B Strep positive  Results for orders placed or performed in visit on  07/04/18 (from the past 24 hour(s))  POC Urinalysis Dipstick OB   Collection Time: 07/04/18  4:15 PM  Result Value Ref Range   Color, UA     Clarity, UA     Glucose, UA Negative (A) (none)   Bilirubin, UA     Ketones, UA small    Spec Grav, UA  1.010 - 1.025   Blood, UA neg    pH, UA  5.0 - 8.0   POC Protein UA Negative Negative, Trace   Urobilinogen, UA  0.2 or 1.0 E.U./dL   Nitrite, UA neg    Leukocytes, UA Negative Negative   Appearance     Odor      Patient Active Problem List   Diagnosis Date Noted  . Normal labor 07/04/2018  . 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 Supervision of normal pregnancy 02/02/2018  . Short interval between pregnancies affecting pregnancy, antepartum 01/20/2018  . Late prenatal care in second trimester 01/20/2018  . History of cesarean delivery 01/20/2018  . History of vaginal delivery following previous cesarean delivery 01/20/2018  . Pregnancy 12/12/2016    Assessment/Plan:  Ana MarusSamara Manning is a 32 y.o. G4P3003 at 202w1d presenting from clinic in advanced labor  #Labor:expectant management #Pain: Would like epidural if able #FWB: Cat I #ID:  Positive, will give Ampicillin #MOF: breast #MOC:BTL (consent signed on 06/01/18) #Circ:  N/a (girl)  Frederik PearJulie P Degele, MD  07/04/2018, 5:24 PM

## 2018-07-04 NOTE — Anesthesia Procedure Notes (Addendum)
Epidural Patient location during procedure: OB Start time: 07/04/2018 5:44 PM End time: 07/04/2018 5:49 PM  Staffing Anesthesiologist: Mal AmabileFoster, Margareth Kanner, MD Performed: anesthesiologist   Preanesthetic Checklist Completed: patient identified, site marked, surgical consent, pre-op evaluation, timeout performed, IV checked, risks and benefits discussed and monitors and equipment checked  Epidural Patient position: sitting Prep: site prepped and draped and DuraPrep Patient monitoring: continuous pulse ox and blood pressure Approach: midline Location: L3-L4 Injection technique: LOR saline  Needle:  Needle type: Tuohy  Needle gauge: 17 G Needle length: 9 cm Needle insertion depth: 6 cm Catheter size: 19 Gauge Catheter at skin depth: 11 cm Test dose: negative and Other (1% lidocaine)  Additional Notes Patient identified. Risks including, but not limited to, bleeding, infection, nerve damage, paralysis, inadequate analgesia, blood pressure changes, nausea, vomiting, allergic reaction, postpartum back pain, itching, and headache were discussed. Patient expressed understanding and wished to proceed. Sterile prep and drape, including hand hygiene, mask, and sterile gloves were used. The patient was positioned and the spine was prepped. The skin was anesthetized with lidocaine. No paraesthesia or other complication noted. The patient did not experience any signs of intravascular injection such as tinnitus or metallic taste in mouth, nor signs of intrathecal spread such as rapid motor block. Please see nursing notes for vital signs. The patient tolerated the procedure well.   Mal AmabileMichael Rilie Glanz, MDReason for block:procedure for pain

## 2018-07-04 NOTE — Anesthesia Preprocedure Evaluation (Signed)
Anesthesia Evaluation  Patient identified by MRN, date of birth, ID band Patient awake    Reviewed: Allergy & Precautions, H&P , Patient's Chart, lab work & pertinent test results  Airway Mallampati: II  TM Distance: >3 FB Neck ROM: full    Dental no notable dental hx. (+) Teeth Intact   Pulmonary neg pulmonary ROS, Current Smoker,    Pulmonary exam normal breath sounds clear to auscultation       Cardiovascular negative cardio ROS Normal cardiovascular exam Rhythm:regular Rate:Normal     Neuro/Psych negative neurological ROS  negative psych ROS   GI/Hepatic negative GI ROS, Neg liver ROS,   Endo/Other  negative endocrine ROS  Renal/GU negative Renal ROS  negative genitourinary   Musculoskeletal   Abdominal   Peds  Hematology negative hematology ROS (+)   Anesthesia Other Findings   Reproductive/Obstetrics (+) Pregnancy                             Anesthesia Physical Anesthesia Plan  ASA: II  Anesthesia Plan: Epidural   Post-op Pain Management:    Induction:   PONV Risk Score and Plan:   Airway Management Planned:   Additional Equipment:   Intra-op Plan:   Post-operative Plan:   Informed Consent: I have reviewed the patients History and Physical, chart, labs and discussed the procedure including the risks, benefits and alternatives for the proposed anesthesia with the patient or authorized representative who has indicated his/her understanding and acceptance.     Plan Discussed with: Anesthesiologist  Anesthesia Plan Comments:         Anesthesia Quick Evaluation

## 2018-07-04 NOTE — Telephone Encounter (Signed)
Patient states she is continuing to cramp and is uncomfortable, requesting w/i appt [facility]  Offered 3:45 but patient stated she could not get here by then so will come at 4:15.

## 2018-07-04 NOTE — Progress Notes (Signed)
   LOW-RISK PREGNANCY VISIT Patient name: Ana Manning MRN [id number]  Date of birth: [birth year] Chief Complaint:   low risk ob (contractions/ 5 cm the other day)  History of Present Illness:   Ana Manning is a 32 y.o. 614P3003 female at 164w1d with an Estimated Date of Delivery: 07/17/18 being seen today for ongoing management of a low-risk pregnancy.  Today she reports contractions all day, worsening. Leaking something. Drove herself to office. Contractions: Regular.  .  Movement: Present. denies leaking of fluid. Review of Systems:   Pertinent items are noted in HPI Denies abnormal vaginal discharge w/ itching/odor/irritation, headaches, visual changes, shortness of breath, chest pain, abdominal pain, severe nausea/vomiting, or problems with urination or bowel movements unless otherwise stated above. Pertinent History Reviewed:  Reviewed past medical,surgical, social, obstetrical and family history.  Reviewed problem list, medications and allergies. Physical Assessment:   Vitals:   07/04/18 1602  BP: (!) 160/90  Pulse: (!) 102  Weight: 135 lb (61.2 kg)  Body mass index is 25.51 kg/m.        Physical Examination:   General appearance: Well appearing, and in no distress  Mental status: Alert, oriented to person, place, and time  Skin: Warm & dry  Cardiovascular: Normal heart rate noted  Respiratory: Normal respiratory effort, no distress  Abdomen: Soft, gravid, nontender  Pelvic: Cervical exam performed SSE: no pooling, no change w/ valsalva, fern & nitrazine neg Dilation: 6.5 Effacement (%): 80 Station: -2  Extremities: Edema: None  Fetal Status: Fetal Heart Rate (bpm): 145   Movement: Present Presentation: Vertex  FHR 3min strip: 140 good variability, 10x10 accel, no decels  Results for orders placed or performed in visit on 07/04/18 (from the past 24 hour(s))  POC Urinalysis Dipstick OB   Collection Time: 07/04/18  4:15 PM  Result Value Ref Range   Color, UA     Clarity,  UA     Glucose, UA Negative (A) (none)   Bilirubin, UA     Ketones, UA small    Spec Grav, UA  1.010 - 1.025   Blood, UA neg    pH, UA  5.0 - 8.0   POC Protein UA Negative Negative, Trace   Urobilinogen, UA  0.2 or 1.0 E.U./dL   Nitrite, UA neg    Leukocytes, UA Negative Negative   Appearance     Odor      Assessment & Plan:  1) Low-risk pregnancy G4P3003 at 7364w1d with an Estimated Date of Delivery: 07/17/18   2) Active labor, [facility], notified L&D charge and Dr. Nira Retortegele  3) Elevated bp today> new, likely pain related, notified Dr. Nira Retortegele   Meds: No orders of the defined types were placed in this encounter.  Labs/procedures today: sve  [facility]  [facility]  Follow-up: Return for 4-6wks for pp visit.  Orders Placed This Encounter  Procedures  . POC Urinalysis Dipstick OB   Cheral MarkerKimberly R Booker CNM, [facility]NP-BC 07/04/2018 4:37 PM

## 2018-07-05 ENCOUNTER — Encounter (HOSPITAL_COMMUNITY)
Admission: AD | Disposition: A | Discharge status: Home / Self Care | Payer: Self-pay | Source: Home / Self Care | Attending: Obstetrics and Gynecology

## 2018-07-05 ENCOUNTER — Telehealth: Payer: Self-pay | Admitting: *Deleted

## 2018-07-05 ENCOUNTER — Inpatient Hospital Stay (HOSPITAL_COMMUNITY): Payer: Medicaid Other | Admitting: Anesthesiology

## 2018-07-05 ENCOUNTER — Encounter (HOSPITAL_COMMUNITY): Payer: Self-pay | Admitting: Anesthesiology

## 2018-07-05 DIAGNOSIS — Z302 Encounter for sterilization: Secondary | ICD-10-CM

## 2018-07-05 HISTORY — PX: TUBAL LIGATION: SHX77

## 2018-07-05 LAB — TYPE AND SCREEN
ABO/RH(D): AB NEG
ANTIBODY SCREEN: NEGATIVE

## 2018-07-05 LAB — RPR: RPR Ser Ql: NONREACTIVE

## 2018-07-05 LAB — KLEIHAUER-BETKE STAIN
# VIALS RHIG: 1
FETAL CELLS %: 0 %
Quantitation Fetal Hemoglobin: 0 mL

## 2018-07-05 LAB — ABO/RH: ABO/RH(D): AB NEG

## 2018-07-05 SURGERY — LIGATION, FALLOPIAN TUBE, POSTPARTUM
Anesthesia: Epidural | Laterality: Bilateral

## 2018-07-05 MED ORDER — FAMOTIDINE 20 MG PO TABS
40.0000 mg | ORAL_TABLET | Freq: Once | ORAL | Status: AC
  Administered 2018-07-05: 40 mg via ORAL
  Filled 2018-07-05: qty 2

## 2018-07-05 MED ORDER — FENTANYL CITRATE (PF) 100 MCG/2ML IJ SOLN
INTRAMUSCULAR | Status: DC | PRN
  Administered 2018-07-05: 100 ug via INTRAVENOUS

## 2018-07-05 MED ORDER — FENTANYL CITRATE (PF) 100 MCG/2ML IJ SOLN
INTRAMUSCULAR | Status: AC
  Filled 2018-07-05: qty 2

## 2018-07-05 MED ORDER — BUPIVACAINE HCL (PF) 0.25 % IJ SOLN
INTRAMUSCULAR | Status: DC | PRN
  Administered 2018-07-05: 30 mL

## 2018-07-05 MED ORDER — RHO D IMMUNE GLOBULIN 1500 UNIT/2ML IJ SOSY
300.0000 ug | PREFILLED_SYRINGE | Freq: Once | INTRAMUSCULAR | Status: AC
  Administered 2018-07-05: 300 ug via INTRAVENOUS
  Filled 2018-07-05: qty 2

## 2018-07-05 MED ORDER — MIDAZOLAM HCL 2 MG/2ML IJ SOLN
INTRAMUSCULAR | Status: AC
Start: 2018-07-05 — End: ?
  Filled 2018-07-05: qty 2

## 2018-07-05 MED ORDER — SODIUM BICARBONATE 8.4 % IV SOLN
INTRAVENOUS | Status: DC | PRN
  Administered 2018-07-05 (×2): 10 mL via EPIDURAL

## 2018-07-05 MED ORDER — PROMETHAZINE HCL 25 MG/ML IJ SOLN: 6.2500 mg | INTRAMUSCULAR | Status: DC | PRN

## 2018-07-05 MED ORDER — KETOROLAC TROMETHAMINE 30 MG/ML IJ SOLN: 30.0000 mg | Freq: Once | INTRAMUSCULAR | Status: DC | PRN

## 2018-07-05 MED ORDER — HYDROMORPHONE HCL 1 MG/ML IJ SOLN: 0.2500 mg | INTRAMUSCULAR | Status: DC | PRN

## 2018-07-05 MED ORDER — MIDAZOLAM HCL 5 MG/5ML IJ SOLN
INTRAMUSCULAR | Status: DC | PRN
  Administered 2018-07-05: 2 mg via INTRAVENOUS

## 2018-07-05 MED ORDER — OXYCODONE HCL 5 MG PO TABS
5.0000 mg | ORAL_TABLET | Freq: Four times a day (QID) | ORAL | Status: DC | PRN
  Administered 2018-07-05 – 2018-07-06 (×2): 5 mg via ORAL
  Filled 2018-07-05 (×2): qty 1

## 2018-07-05 MED ORDER — METOCLOPRAMIDE HCL 10 MG PO TABS
10.0000 mg | ORAL_TABLET | Freq: Once | ORAL | Status: AC
  Administered 2018-07-05: 10 mg via ORAL
  Filled 2018-07-05: qty 1

## 2018-07-05 MED ORDER — LACTATED RINGERS IV SOLN
INTRAVENOUS | Status: DC
  Administered 2018-07-05 (×2): via INTRAVENOUS

## 2018-07-05 MED ORDER — MEPERIDINE HCL 25 MG/ML IJ SOLN: 6.2500 mg | INTRAMUSCULAR | Status: DC | PRN

## 2018-07-05 MED ORDER — LIDOCAINE-EPINEPHRINE (PF) 2 %-1:200000 IJ SOLN
INTRAMUSCULAR | Status: AC
  Filled 2018-07-05: qty 20

## 2018-07-05 MED ORDER — BUPIVACAINE HCL (PF) 0.25 % IJ SOLN
INTRAMUSCULAR | Status: AC
  Filled 2018-07-05: qty 30

## 2018-07-05 MED ORDER — SODIUM BICARBONATE 8.4 % IV SOLN
INTRAVENOUS | Status: AC
  Filled 2018-07-05: qty 50

## 2018-07-05 SURGICAL SUPPLY — 27 items
BENZOIN TINCTURE PRP APPL 2/3 (GAUZE/BANDAGES/DRESSINGS) ×3 IMPLANT
BLADE SURG 11 STRL SS (BLADE) ×3 IMPLANT
CLOSURE STERI STRIP 1/2 X4 (GAUZE/BANDAGES/DRESSINGS) ×3 IMPLANT
CLOTH BEACON ORANGE TIMEOUT ST (SAFETY) ×3 IMPLANT
DRESSING OPSITE X SMALL 2X3 (GAUZE/BANDAGES/DRESSINGS) ×3 IMPLANT
DRSG OPSITE POSTOP 3X4 (GAUZE/BANDAGES/DRESSINGS) ×3 IMPLANT
DURAPREP 26ML APPLICATOR (WOUND CARE) ×3 IMPLANT
GLOVE BIOGEL PI IND STRL 7.0 (GLOVE) ×1 IMPLANT
GLOVE BIOGEL PI IND STRL 7.5 (GLOVE) ×1 IMPLANT
GLOVE BIOGEL PI INDICATOR 7.0 (GLOVE) ×2
GLOVE BIOGEL PI INDICATOR 7.5 (GLOVE) ×2
GLOVE ECLIPSE 7.5 STRL STRAW (GLOVE) ×3 IMPLANT
GOWN STRL REUS W/TWL LRG LVL3 (GOWN DISPOSABLE) ×6 IMPLANT
NEEDLE HYPO 22GX1.5 SAFETY (NEEDLE) ×3 IMPLANT
NS IRRIG 1000ML POUR BTL (IV SOLUTION) ×3 IMPLANT
PACK ABDOMINAL MINOR (CUSTOM PROCEDURE TRAY) ×3 IMPLANT
PROTECTOR NERVE ULNAR (MISCELLANEOUS) ×3 IMPLANT
SPONGE LAP 4X18 RFD (DISPOSABLE) ×3 IMPLANT
SPONGE LAP 4X18 X RAY DECT (DISPOSABLE) IMPLANT
SUT VIC AB 3-0 SH 27 (SUTURE) ×2
SUT VIC AB 3-0 SH 27X BRD (SUTURE) ×1 IMPLANT
SUT VICRYL 0 UR6 27IN ABS (SUTURE) ×3 IMPLANT
SUT VICRYL 4-0 PS2 18IN ABS (SUTURE) ×3 IMPLANT
SYR CONTROL 10ML LL (SYRINGE) ×3 IMPLANT
TOWEL OR 17X24 6PK STRL BLUE (TOWEL DISPOSABLE) ×6 IMPLANT
TRAY FOLEY CATH SILVER 14FR (SET/KITS/TRAYS/PACK) ×3 IMPLANT
WATER STERILE IRR 1000ML POUR (IV SOLUTION) ×3 IMPLANT

## 2018-07-05 NOTE — Transfer of Care (Signed)
Immediate Anesthesia Transfer of Care Note  Patient: Ana Manning  Procedure(s) Performed: POST PARTUM TUBAL LIGATION (Bilateral )  Patient Location: PACU  Anesthesia Type:Epidural  Level of Consciousness: awake, alert  and oriented  Airway & Oxygen Therapy: Patient Spontanous Breathing  Post-op Assessment: Report given to RN and Post -op Vital signs reviewed and stable  Post vital signs: Reviewed and stable  Last Vitals:  Vitals Value Taken Time  BP 101/51 07/05/2018  1:38 PM  Temp    Pulse 93 07/05/2018  1:42 PM  Resp 12 07/05/2018  1:42 PM  SpO2 100 % 07/05/2018  1:42 PM  Vitals shown include unvalidated device data.  Last Pain:  Vitals:   07/05/18 1210  TempSrc: Oral  PainSc:          Complications: No apparent anesthesia complications

## 2018-07-05 NOTE — Anesthesia Postprocedure Evaluation (Signed)
Anesthesia Post Note  Patient: Ana Manning  Procedure(s) Performed: POST PARTUM TUBAL LIGATION (Bilateral )     Patient location during evaluation: PACU Anesthesia Type: Epidural Level of consciousness: awake Pain management: pain level controlled Vital Signs Assessment: post-procedure vital signs reviewed and stable Respiratory status: spontaneous breathing Cardiovascular status: stable Postop Assessment: no headache, no backache, epidural receding, patient able to bend at knees and no apparent nausea or vomiting Anesthetic complications: no    Last Vitals:  Vitals:   07/05/18 1415 07/05/18 1439  BP: 113/76 124/88  Pulse: 81 83  Resp: 19 20  Temp:  (!) 36.3 C  SpO2: 100% 100%    Last Pain:  Vitals:   07/05/18 1439  TempSrc: Axillary  PainSc: 4    Pain Goal: Patients Stated Pain Goal: 3 (07/05/18 1439)               Laurey Salser JR,JOHN Susann GivensFRANKLIN

## 2018-07-05 NOTE — Addendum Note (Signed)
Addendum  created 07/05/18 1844 by Algis GreenhouseBurger, Nonie Lochner A, CRNA   Sign clinical note

## 2018-07-05 NOTE — Anesthesia Preprocedure Evaluation (Signed)
Anesthesia Evaluation  Patient identified by MRN, date of birth, ID band Patient awake    Reviewed: Allergy & Precautions, H&P , NPO status , Patient's Chart, lab work & pertinent test results  Airway Mallampati: I  TM Distance: >3 FB Neck ROM: full    Dental no notable dental hx. (+) Teeth Intact   Pulmonary neg pulmonary ROS, Current Smoker,    Pulmonary exam normal breath sounds clear to auscultation       Cardiovascular negative cardio ROS Normal cardiovascular exam Rhythm:regular Rate:Normal     Neuro/Psych negative neurological ROS  negative psych ROS   GI/Hepatic negative GI ROS, Neg liver ROS,   Endo/Other  negative endocrine ROS  Renal/GU negative Renal ROS  negative genitourinary   Musculoskeletal   Abdominal Normal abdominal exam  (+)   Peds  Hematology negative hematology ROS (+)   Anesthesia Other Findings   Reproductive/Obstetrics (+) Pregnancy                             Anesthesia Physical  Anesthesia Plan  ASA: II  Anesthesia Plan: Epidural   Post-op Pain Management:    Induction:   PONV Risk Score and Plan:   Airway Management Planned: Natural Airway and Nasal Cannula  Additional Equipment:   Intra-op Plan:   Post-operative Plan:   Informed Consent: I have reviewed the patients History and Physical, chart, labs and discussed the procedure including the risks, benefits and alternatives for the proposed anesthesia with the patient or authorized representative who has indicated his/her understanding and acceptance.     Plan Discussed with: CRNA and Surgeon  Anesthesia Plan Comments: (Using labor epidural for PPTL)        Anesthesia Quick Evaluation

## 2018-07-05 NOTE — Anesthesia Postprocedure Evaluation (Signed)
Anesthesia Post Note  Patient: Ana Manning  Procedure(s) Performed: AN AD HOC LABOR EPIDURAL     Patient location during evaluation: Mother Baby Anesthesia Type: Epidural Level of consciousness: awake Pain management: satisfactory to patient Vital Signs Assessment: post-procedure vital signs reviewed and stable Respiratory status: spontaneous breathing Cardiovascular status: stable Anesthetic complications: no    Last Vitals:  Vitals:   07/05/18 0126 07/05/18 0512  BP: 125/72 128/87  Pulse: 70 87  Resp: 17 17  Temp: 37.2 C 36.8 C  SpO2:      Last Pain:  Vitals:   07/05/18 0512  TempSrc: Axillary  PainSc: 6    Pain Goal:                 KeyCorpBURGER,Ana Manning

## 2018-07-05 NOTE — Op Note (Signed)
Ana Manning 07/05/2018 4:25 PM  PREOPERATIVE DIAGNOSIS:  Undesired fertility  POSTOPERATIVE DIAGNOSIS:  Undesired fertility  PROCEDURE:  Postpartum Bilateral Tubal Sterilization using Pomeroy method   SURGEON: Surgeon(s) and Role:    * Levie HeritageStinson, Jacob J, DO - Primary    * Arvilla MarketWallace, Raylynn Hersh Lauren, DO - Assisting - Attending          ANESTHESIA:  Epidural  COMPLICATIONS:  None immediate.  ESTIMATED BLOOD LOSS:  Less than 20cc.  FLUIDS: 900 cc LR.  URINE OUTPUT:  400 cc of clear urine.  INDICATIONS: 32 y.o. yo [id number]  with undesired fertility,status post vaginal delivery, desires permanent sterilization. Risks and benefits of procedure discussed with patient including permanence of method, bleeding, infection, injury to surrounding organs and need for additional procedures. Risk failure of 0.5-1% with increased risk of ectopic gestation if pregnancy occurs was also discussed with patient.   FINDINGS:  Normal uterus, tubes, and ovaries.  TECHNIQUE: After informed consent was obtained, the patient was taken to the operating room where anesthesia was induced and found to be adequate. A small transverse, infraumbilical skin incision was made with the scalpel. This incision was carried down to the underlying layer of fascia. The fascia was grasped with Kocher clamps tented up and entered sharply with Mayo scissors. Underlying peritoneum was then identified tented up and entered sharply with Mayo scissors. The patient's left fallopian tube was then identified, brought to the incision, and grasped with a Babcock clamp. The tube was then followed out to the fimbria. The Babcock clamp was then used to grasp the tube approximately 4 cm from the cornual region. A 3 cm segment of the tube was then ligated with free tie of plain gut suture, transected and excised. Good hemostasis was noted and the tube was returned to the abdomen. The right fallopian tube was then identified to its fimbriated end,  ligated, and a 3 cm segment excised in a similar fashion. Excellent hemostasis was noted, and the tube returned to the abdomen. The fascia was re-approximated with 0 Vicryl. The skin was closed in a subcuticular fashion with 3-0 Vicryl. 30 cc of Quarter percent Marcaine solution was then injected at the incision site. The patient tolerated the procedure well. Sponge, lap, and needle count were correct x2. The patient was taken to recovery room in stable condition.  Marcy Sirenatherine Kesean Serviss, D.O. OB Fellow  07/05/2018, 4:25 PM

## 2018-07-05 NOTE — Telephone Encounter (Signed)
Lmom for pt to call us back to set up pp appointment.  07-05-18   AS

## 2018-07-05 NOTE — Progress Notes (Signed)
Patient ID: Ana Manning, female   DOB: [birth year], 32 y.o.   MRN: [id number]  H&P reviewed. Patient postpartum and desires PP BTL.  Risks of procedure discussed with patient including but not limited to: risk of regret, permanence of method, bleeding, infection, injury to surrounding organs and need for additional procedures.  Failure risk of 1 -2 % with increased risk of ectopic gestation if pregnancy occurs was also discussed with patient.    Levie HeritageStinson, Floride Hutmacher J, DO 07/05/2018 11:17 AM

## 2018-07-05 NOTE — Anesthesia Postprocedure Evaluation (Signed)
Anesthesia Post Note  Patient: Ana Manning  Procedure(s) Performed: POST PARTUM TUBAL LIGATION (Bilateral )     Patient location during evaluation: Mother Baby Anesthesia Type: Epidural Level of consciousness: awake Pain management: satisfactory to patient Vital Signs Assessment: post-procedure vital signs reviewed and stable Respiratory status: spontaneous breathing Cardiovascular status: stable Anesthetic complications: no    Last Vitals:  Vitals:   07/05/18 1415 07/05/18 1439  BP: 113/76 124/88  Pulse: 81 83  Resp: 19 20  Temp:  (!) 36.3 C  SpO2: 100% 100%    Last Pain:  Vitals:   07/05/18 1439  TempSrc: Axillary  PainSc: 4    Pain Goal: Patients Stated Pain Goal: 3 (07/05/18 1439)               Cephus ShellingBURGER,Future [place]

## 2018-07-05 NOTE — Clinical Social Work Maternal (Signed)
CLINICAL SOCIAL WORK MATERNAL/CHILD NOTE  Patient Details  Name: Ana Manning MRN: [id number] Date of Birth: [birth year]  Date:  07/05/2018  Clinical Social Worker Initiating Note:  Laurey Arrow Date/Time: Initiated:  07/05/18/1151     Child's Name:  Ana Manning   Biological Parents:  Mother(MOB refused to provide any information about FOB and reported that FOB will not be involved. )   Need for Interpreter:  None   Reason for Referral:  Current Substance Use/Substance Use During Pregnancy    Address:  [address] [place] [address]    Phone number:  [phone] (home)     Additional phone number:   Household Members/Support Persons (HM/SP):   Household Member/Support Person 1   HM/SP Name Relationship DOB or Age  HM/SP -1 Jaylen Charles Boy 12/31/2016  HM/SP -2        HM/SP -3        HM/SP -4        HM/SP -5        HM/SP -6        HM/SP -7        HM/SP -8          Natural Supports (not living in the home):  Children   Professional Supports: None   Employment: Full-time   Type of Work: (MOB did not want to [employer] her place of employment however reported that she works full-time. )   Education:      Homebound arranged:    Museum/gallery curator Resources:  Medicaid   Other Resources:  [employer], Physicist, medical    Cultural/Religious Considerations Which May Impact Care:  Per [employer] Sheet, MOB is [employer].   Strengths:  Ability to meet basic needs , Pediatrician chosen, Home prepared for child    Psychotropic Medications:         Pediatrician:    ([place]. )  Pediatrician List:   [place]      Pediatrician Fax Number:    Risk Factors/Current Problems:  Substance Use    Cognitive State:  Able to Concentrate , Alert , Linear Thinking    Mood/Affect:  Apprehensive , Irritable , Fearful , Tearful    CSW Assessment:  CSW met with MOB to complete an assessment for hx of substance use. When CSW arrived, MOB was asleep in the bed with infant in her arms.  MOB was easily awaken and CSW provided MOB with education regarding SIDS and safe sleep.  MOB did not appear receptive as evidence by rolling her eyes and aggressively communicating "I can't get no damn sleep because people keep coming in and out of this room."  CSW acknowledged MOB's lack of sleep and offered to place infant in bassinet and return at a later time to complete the assessment. MOB declined. MOB was appropriate with infant during the assessment and responded appropriately to infant's needs. MOB was tearful, sad, and apprehensive about meeting with CSW initially however, throughout the assessment she became more receptive.   CSW asked about MOB SA hx and MOB acknowledged smoking marijuana since 02-03-2023 to assist MOB with increasing her appetite.  MOB shared that MOB found her brother dead in 02/03/23 from a drug overdose and dealing with the grief and loss resulted in MOB losing her appetite. CSW validated and normalized MOB's thoughts and feelings about her recent  loss and offered MOB resources to outpatient counseling; MOB declined.  MOB reported having minimus supports and but continued to declined counseling supports and resources.   CSW explained hospital's SA policy and MOB became tearful and expressed fear of having her children removed by CPS.  MOB acknowledged CPS hx and communicated issues with trust with [facility] CPS.  CSW explained CPS investigation process and encouraged MOB to utilized CPS a resource.  CSW made MOB aware that CSW will continue to monitor infants UDS and CDS and will make a report if warranted.   CSW asked about MOB needing an earlier discharge and MOB stated, "I thought I was to leave early because I needed to go and pay my bills but I have someone helping me with it now." CSW encouraged MOB to speak with MOB's bedside nurse  if anything changes.   MOB reported having all essential items need to for infant and feeling prepared to parent.     CSW Plan/Description:  No Further Intervention Required/No Barriers to Discharge, Sudden Infant Death Syndrome (SIDS) Education, Perinatal Mood and Anxiety Disorder (PMADs) Education, [place], CSW Will Continue to Monitor Umbilical Cord Tissue Drug Screen Results and Make Report if Warranted  Laurey Arrow, MSW, LCSW Clinical Social Work [phone]   Dimple Nanas, LCSW 07/05/2018, 11:59 AM

## 2018-07-05 NOTE — Lactation Note (Signed)
This note was copied from a baby's chart. Lactation Consultation Note  Patient Name: Ana Manning MarusSamara Latchford OZHYQ'MToday's Date: 07/05/2018 Reason for consult: Initial assessment;Early term 37-38.6wks;Infant < 6lbs Breastfeeding consultation services and support information given and reviewed.  Baby is currently on the left breast in cradle hold.  Baby is feeding actively with swallows. Instructed to feed with any feeding cue.  Encouraged to use breast massage during feeding.  Instructed to call with questions or assist.  Maternal Data    Feeding    LATCH Score                   Interventions    Lactation Tools Discussed/Used     Consult Status Consult Status: Follow-up Date: 07/06/18 Follow-up type: In-patient    Huston FoleyMOULDEN, Fay Swider S 07/05/2018, 10:02 AM

## 2018-07-05 NOTE — Progress Notes (Addendum)
  Post Partum Day 1 Subjective: no complaints, up ad lib, voiding and tolerating PO  Objective: Blood pressure 124/88, pulse 83, temperature (!) 97.3 F (36.3 C), temperature source Axillary, resp. rate 20, height 5\' 1"  (1.549 m), weight 61.2 kg (135 lb), last menstrual period 09/12/2017, SpO2 100 %, unknown if currently breastfeeding.  Physical Exam:  General: alert and cooperative Lochia: appropriate Uterine Fundus: firm Incision: None DVT Evaluation: No evidence of DVT seen on physical exam. Posterior calf tenderness present.  Recent Labs    07/04/18 1720  HGB 12.0  HCT 35.6*    Assessment/Plan: Plan for discharge tomorrow  Social worker followup followup UDS BTL today.    LOS: 1 day   Sandi Ravelingnson B Alvis 07/05/2018, 3:11 PM    OB FELLOW POSTPARTUM PROGRESS NOTE ATTESTATION  I have seen and examined this patient and agree with above documentation in the resident's note.   Marcy Sirenatherine Shivank Pinedo, D.O. OB Fellow  07/05/2018, 4:23 PM

## 2018-07-06 DIAGNOSIS — Z6791 Unspecified blood type, Rh negative: Secondary | ICD-10-CM

## 2018-07-06 DIAGNOSIS — O139 Gestational [pregnancy-induced] hypertension without significant proteinuria, unspecified trimester: Secondary | ICD-10-CM | POA: Diagnosis not present

## 2018-07-06 DIAGNOSIS — O26899 Other specified pregnancy related conditions, unspecified trimester: Secondary | ICD-10-CM

## 2018-07-06 LAB — RH IG WORKUP (INCLUDES ABO/RH)
ABO/RH(D): AB NEG
Gestational Age(Wks): 38.1
UNIT DIVISION: 0
Weak D: POSITIVE

## 2018-07-06 MED ORDER — TRIAMTERENE-HCTZ 37.5-25 MG PO TABS: 1.0000 | ORAL_TABLET | Freq: Every day | ORAL | 1 refills | Status: AC

## 2018-07-06 MED ORDER — OXYCODONE HCL 5 MG PO TABS: 5.0000 mg | ORAL_TABLET | Freq: Four times a day (QID) | ORAL | 0 refills | Status: AC | PRN

## 2018-07-06 MED ORDER — TRIAMTERENE-HCTZ 37.5-25 MG PO TABS
1.0000 | ORAL_TABLET | Freq: Every day | ORAL | Status: DC
  Administered 2018-07-06: 1 via ORAL
  Filled 2018-07-06 (×2): qty 1

## 2018-07-06 MED ORDER — IBUPROFEN 600 MG PO TABS: 600.0000 mg | ORAL_TABLET | Freq: Four times a day (QID) | ORAL | 0 refills | Status: AC | PRN

## 2018-07-06 NOTE — Discharge Summary (Signed)
OB Discharge Summary     Patient Name: Ana MarusSamara Knape DOB: 05/21/1986 MRN: [id number]  Date of admission: 07/04/2018 Delivering MD: Frederik PearEGELE, JULIE P   Date of discharge: 07/06/2018  Admitting diagnosis: 12 WKS, SHARP PAINS ON RIGHT SIDE Intrauterine pregnancy: 6344w1d     Secondary diagnosis:  Active Problems:   Short interval between pregnancies affecting pregnancy, antepartum   Late prenatal care in second trimester   History of cesarean delivery   History of vaginal delivery following previous cesarean delivery   Supervision of normal pregnancy   Marijuana use   Normal labor   Gestational hypertension   Rh negative status during pregnancy  Additional problems: GBS pos     Discharge diagnosis: Term Pregnancy Delivered and Gestational Hypertension                                                                                                Post partum procedures:postpartum tubal ligation and Rhogam  Augmentation: none  Complications: None  Hospital course:  Onset of Labor With Vaginal Delivery     32 y.o. yo [id number] at 6144w1d was admitted in Active Labor on 07/04/2018. Patient had an uncomplicated, precipitous labor course as she was 6cm at the office and sent to L&D, arriving in advanced labor and delivering shortly afterwards. She had some elevated BPs in labor, but not severe range. Her UPC ratio was 0.31. Membrane Rupture Time/Date: 5:30 PM ,07/04/2018   Intrapartum Procedures: Episiotomy: None [1]                                         Lacerations:  1st degree [2]  Patient had a delivery of a Viable infant. 07/04/2018  Information for the patient's newborn:  Marylyn IshiharaHerbin, Girl Moishe SpiceSamara [[id number]][[id number]]  Delivery Method: VBAC    Pateint had an uncomplicated postpartum course. On PPD#1 she had a ppBTL procedure which she tolerated well.  She is ambulating, tolerating a regular diet, passing flatus, and urinating well. Patient is discharged home in stable condition on 07/06/18. At the  time of d/c she was started on Maxzide due to some borderline BPs.   Physical exam  Vitals:   07/05/18 1545 07/05/18 1947 07/06/18 0100 07/06/18 0447  BP: 135/78 136/89 123/76 137/85  Pulse: 88 95 95   Resp: 18 18 16 16   Temp: 98 F (36.7 C) 98.9 F (37.2 C)  98.7 F (37.1 C)  TempSrc: Oral Oral  Oral  SpO2: 98% 97%    Weight:      Height:       General: alert Lochia: appropriate Uterine Fundus: firm Incision: tubal inc with honeycomb intact, stained and unchanged DVT Evaluation: No evidence of DVT seen on physical exam. Labs: Lab Results  Component Value Date   WBC 11.7 (H) 07/04/2018   HGB 12.0 07/04/2018   HCT 35.6 (L) 07/04/2018   MCV 98.3 07/04/2018   PLT 189 07/04/2018   CMP Latest Ref Rng & Units 07/04/2018  Glucose 70 - 99 mg/dL 73  BUN 6 - 20 mg/dL 6  Creatinine 1.61 - 0.96 mg/dL 0.45  Sodium 409 - 811 mmol/L 134(L)  Potassium 3.5 - 5.1 mmol/L 3.3(L)  Chloride 98 - 111 mmol/L 102  CO2 22 - 32 mmol/L 20(L)  Calcium 8.9 - 10.3 mg/dL 9.1(Y)  Total Protein 6.5 - 8.1 g/dL 7.3  Total Bilirubin 0.3 - 1.2 mg/dL 0.9  Alkaline Phos 38 - 126 U/L 299(H)  AST 15 - 41 U/L 41  ALT 0 - 44 U/L 46(H)    Discharge instruction: per After Visit Summary and "Baby and Me Booklet".  After visit meds:  Allergies as of 07/06/2018      Reactions   Other    Tomatoes      Medication List    TAKE these medications   ibuprofen 600 MG tablet Commonly known as:  ADVIL,MOTRIN Take 1 tablet (600 mg total) by mouth every 6 (six) hours as needed.   multivitamin-prenatal 27-0.8 MG Tabs tablet Take 1 tablet by mouth daily at 12 noon.   oxyCODONE 5 MG immediate release tablet Commonly known as:  Oxy IR/ROXICODONE Take 1 tablet (5 mg total) by mouth every 6 (six) hours as needed for severe pain.   triamterene-hydrochlorothiazide 37.5-25 MG tablet Commonly known as:  MAXZIDE-25 Take 1 tablet by mouth daily.       Diet: routine diet  Activity: Advance as tolerated. Pelvic  rest for 6 weeks.   Outpatient follow up:BP check in 1 week; PP visit in 4-6 weeks Follow up Appt:No future appointments. Follow up Visit:No follow-ups on file.  Postpartum contraception: Tubal Ligation  Newborn Data: Live born female  Birth Weight: 6 lb 2.4 oz (2790 g) APGAR: 8, 9  Newborn Delivery   Birth date/time:  07/04/2018 18:12:00 Delivery type:  VBAC, Spontaneous     Baby Feeding: Breast Disposition:home with mother   07/06/2018 Cam Hai, CNM  7:16 AM

## 2018-07-06 NOTE — Discharge Instructions (Signed)
Laparoscopic Tubal Ligation, Care After °Refer to this sheet in the next few weeks. These instructions provide you with information about caring for yourself after your procedure. Your health care provider may also give you more specific instructions. Your treatment has been planned according to current medical practices, but problems sometimes occur. Call your health care provider if you have any problems or questions after your procedure. °What can I expect after the procedure? °After the procedure, it is common to have: °· A sore throat. °· Discomfort in your shoulder. °· Mild discomfort or cramping in your abdomen. °· Gas pains. °· Pain or soreness in the area where the surgical cut (incision) was made. °· A bloated feeling. °· Tiredness. °· Nausea. °· Vomiting. ° °Follow these instructions at home: °Medicines °· Take over-the-counter and prescription medicines only as told by your health care provider. °· Do not take aspirin because it can cause bleeding. °· Do not drive or operate heavy machinery while taking prescription pain medicine. °Activity °· Rest for the rest of the day. °· Return to your normal activities as told by your health care provider. Ask your health care provider what activities are safe for you. °Incision care ° °· Follow instructions from your health care provider about how to take care of your incision. Make sure you: °? Wash your hands with soap and water before you change your bandage (dressing). If soap and water are not available, use hand sanitizer. °? Change your dressing as told by your health care provider. °? Leave stitches (sutures) in place. They may need to stay in place for 2 weeks or longer. °· Check your incision area every day for signs of infection. Check for: °? More redness, swelling, or pain. °? More fluid or blood. °? Warmth. °? Pus or a bad smell. °Other Instructions °· Do not take baths, swim, or use a hot tub until your health care provider approves. You may take  showers. °· Keep all follow-up visits as told by your health care provider. This is important. °· Have someone help you with your daily household tasks for the first few days. °Contact a health care provider if: °· You have more redness, swelling, or pain around your incision. °· Your incision feels warm to the touch. °· You have pus or a bad smell coming from your incision. °· The edges of your incision break open after the sutures have been removed. °· Your pain does not improve after 2-3 days. °· You have a rash. °· You repeatedly become dizzy or light-headed. °· Your pain medicine is not helping. °· You are constipated. °Get help right away if: °· You have a fever. °· You faint. °· You have increasing pain in your abdomen. °· You have severe pain in one or both of your shoulders. °· You have fluid or blood coming from your sutures or from your vagina. °· You have shortness of breath or difficulty breathing. °· You have chest pain or leg pain. °· You have ongoing nausea, vomiting, or diarrhea. °This information is not intended to replace advice given to you by your health care provider. Make sure you discuss any questions you have with your health care provider. °Document Released: 06/19/2005 Document Revised: 05/04/2016 Document Reviewed: 11/10/2015 °Elsevier Interactive Patient Education © 2018 [employer]vier Inc[employer] °Vaginal Delivery, Care After °Refer to this sheet in the next few weeks. These instructions provide you with information about caring for yourself after vaginal delivery. Your health care provider may also give you more   specific instructions. Your treatment has been planned according to current medical practices, but problems sometimes occur. Call your health care provider if you have any problems or questions. What can I expect after the procedure? After vaginal delivery, it is common to have:  Some bleeding from your vagina.  Soreness in your abdomen, your vagina, and the area of skin between your  vaginal opening and your anus (perineum).  Pelvic cramps.  Fatigue.  Follow these instructions at home: Medicines  Take over-the-counter and prescription medicines only as told by your health care provider.  If you were prescribed an antibiotic medicine, take it as told by your health care provider. Do not stop taking the antibiotic until it is finished. Driving   Do not drive or operate heavy machinery while taking prescription pain medicine.  Do not drive for 24 hours if you received a sedative. Lifestyle  Do not drink alcohol. This is especially important if you are breastfeeding or taking medicine to relieve pain.  Do not use tobacco products, including cigarettes, chewing tobacco, or e-cigarettes. If you need help quitting, ask your health care provider. Eating and drinking  Drink at least 8 eight-ounce glasses of water every day unless you are told not to by your health care provider. If you choose to breastfeed your baby, you may need to drink more water than this.  Eat high-fiber foods every day. These foods may help prevent or relieve constipation. High-fiber foods include: ? Whole grain cereals and breads. ? Brown rice. ? Beans. ? Fresh fruits and vegetables. Activity  Return to your normal activities as told by your health care provider. Ask your health care provider what activities are safe for you.  Rest as much as possible. Try to rest or take a nap when your baby is sleeping.  Do not lift anything that is heavier than your baby or 10 lb (4.5 kg) until your health care provider says that it is safe.  Talk with your health care provider about when you can engage in sexual activity. This may depend on your: ? Risk of infection. ? Rate of healing. ? Comfort and desire to engage in sexual activity. Vaginal Care  If you have an episiotomy or a vaginal tear, check the area every day for signs of infection. Check for: ? More redness, swelling, or pain. ? More  fluid or blood. ? Warmth. ? Pus or a bad smell.  Do not use tampons or douches until your health care provider says this is safe.  Watch for any blood clots that may pass from your vagina. These may look like clumps of dark red, brown, or black discharge. General instructions  Keep your perineum clean and dry as told by your health care provider.  Wear loose, comfortable clothing.  Wipe from front to back when you use the toilet.  Ask your health care provider if you can shower or take a bath. If you had an episiotomy or a perineal tear during labor and delivery, your health care provider may tell you not to take baths for a certain length of time.  Wear a bra that supports your breasts and fits you well.  If possible, have someone help you with household activities and help care for your baby for at least a few days after you leave the hospital.  Keep all follow-up visits for you and your baby as told by your health care provider. This is important. Contact a health care provider if:  You have: ?  Vaginal discharge that has a bad smell. ? Difficulty urinating. ? Pain when urinating. ? A sudden increase or decrease in the frequency of your bowel movements. ? More redness, swelling, or pain around your episiotomy or vaginal tear. ? More fluid or blood coming from your episiotomy or vaginal tear. ? Pus or a bad smell coming from your episiotomy or vaginal tear. ? A fever. ? A rash. ? Little or no interest in activities you used to enjoy. ? Questions about caring for yourself or your baby.  Your episiotomy or vaginal tear feels warm to the touch.  Your episiotomy or vaginal tear is separating or does not appear to be healing.  Your breasts are painful, hard, or turn red.  You feel unusually sad or worried.  You feel nauseous or you vomit.  You pass large blood clots from your vagina. If you pass a blood clot from your vagina, save it to show to your health care provider. Do  not flush blood clots down the toilet without having your health care provider look at them.  You urinate more than usual.  You are dizzy or light-headed.  You have not breastfed at all and you have not had a menstrual period for 12 weeks after delivery.  You have stopped breastfeeding and you have not had a menstrual period for 12 weeks after you stopped breastfeeding. Get help right away if:  You have: ? Pain that does not go away or does not get better with medicine. ? Chest pain. ? Difficulty breathing. ? Blurred vision or spots in your vision. ? Thoughts about hurting yourself or your baby.  You develop pain in your abdomen or in one of your legs.  You develop a severe headache.  You faint.  You bleed from your vagina so much that you fill two sanitary pads in one hour. This information is not intended to replace advice given to you by your health care provider. Make sure you discuss any questions you have with your health care provider. Document Released: 11/27/2000 Document Revised: 05/13/2016 Document Reviewed: 12/15/2015 Elsevier Interactive Patient Education  2018 [employer]

## 2018-07-06 NOTE — Lactation Note (Signed)
This note was copied from a baby's chart. Lactation Consultation Note  Patient Name: Ana Manning [id number]'s Date: 07/06/2018 Reason for consult: Follow-up assessment;Infant < 6lbs  Visited with P4 Mom on day of discharge (late 6pm).  Baby at 7% weight loss, Mom giving bottles of formula as baby latching was painful.  Mom last breastfed at 4am.   Both breasts are filling.  Offered to assist/assess baby's latch.  Baby placed in football hold on left breast.  Baby latched easily and regular swallows identified.  Mom denied any discomfort.   Encouraged STS and cue based feeding, with goal of 8-12 feedings per 24 hrs.   Engorgement prevention and treatment discussed.  Encouraged frequent breastfeeding before supplementing baby.  Mom not sure how much she wants to do this. Lactation support after discharge discussed.   Mom to call prn.  Consult Status Consult Status: Complete Date: 07/06/18 Follow-up type: Call as needed    Ana Manning, Ana Manning 07/06/2018, 12:07 PM

## 2018-07-07 ENCOUNTER — Telehealth: Payer: Self-pay | Admitting: *Deleted

## 2018-07-07 ENCOUNTER — Encounter (HOSPITAL_COMMUNITY): Payer: Self-pay | Admitting: Family Medicine

## 2018-07-07 ENCOUNTER — Encounter: Payer: Medicaid Other | Admitting: Women's Health

## 2018-07-07 NOTE — Telephone Encounter (Signed)
Called pt and set up pp appointment.  Informed pt that she needed to have a bp check here next week.  Pt declined and [facility].  07-07-18  AS

## 2018-07-07 NOTE — Telephone Encounter (Signed)
Unable to reach pt by phone.  Spoke to uncle and informed him to have pt to call us back.  07-07-18  AS

## 2018-07-14 ENCOUNTER — Encounter: Payer: Self-pay | Admitting: Women's Health

## 2018-07-19 ENCOUNTER — Encounter (HOSPITAL_COMMUNITY): Payer: Self-pay

## 2018-07-21 ENCOUNTER — Telehealth: Payer: Self-pay | Admitting: Women's Health

## 2018-07-21 NOTE — Telephone Encounter (Signed)
Please call pt back she needs to know what kind of test she had when she was preg. All std screening , per pt her baby is sick and her dr cant figure out what is wrong and she thinks it came from her. I told her if she had any positive results that we should have already told her and she said she needed to talk to someone herself.

## 2018-07-21 NOTE — Telephone Encounter (Signed)
Pt called requesting to know what STD tests and labs were done on her while she was pregnant. She states that her baby has become sick and the doctors cant figure out why. DOB verified. Advised pt that we checked for gonorrhea, chlamydia, syphillis, HIV and Hep B. Advised that all came back negative. Advised that she was GBS positive. She asks if we checked for herpes. I informed her that we did not. She states that she had a "bump" on her vagina when she delivered and wonders if it could have been herpes. Advised that I have no way of knowing that now. Advised that she should share that information with the pediatricians and see what their recommendations are. She states that "they dont know what's wrong and she is not happy". Advised again that she should discuss her concerns with the pediatricians. Pt verbalized understanding.

## 2018-08-01 ENCOUNTER — Ambulatory Visit: Payer: Medicaid Other | Admitting: Women's Health

## 2018-08-03 ENCOUNTER — Ambulatory Visit: Payer: Medicaid Other | Admitting: Women's Health

## 2018-08-03 ENCOUNTER — Encounter: Payer: Self-pay | Admitting: *Deleted

## 2018-08-04 ENCOUNTER — Ambulatory Visit: Payer: Medicaid Other | Admitting: Women's Health

## 2018-08-11 ENCOUNTER — Ambulatory Visit: Payer: Medicaid Other | Admitting: Advanced Practice Midwife

## 2018-08-25 ENCOUNTER — Ambulatory Visit (INDEPENDENT_AMBULATORY_CARE_PROVIDER_SITE_OTHER): Payer: Medicaid Other | Admitting: Women's Health

## 2018-08-25 ENCOUNTER — Encounter: Payer: Self-pay | Admitting: Women's Health

## 2018-08-25 DIAGNOSIS — L0292 Furuncle, unspecified: Secondary | ICD-10-CM

## 2018-08-25 MED ORDER — SULFAMETHOXAZOLE-TRIMETHOPRIM 800-160 MG PO TABS: 1.0000 | ORAL_TABLET | Freq: Two times a day (BID) | ORAL | 0 refills | Status: AC

## 2018-08-25 NOTE — Progress Notes (Signed)
POSTPARTUM VISIT Patient name: Ana Manning MRN [id number]  Date of birth: [birth year] Chief Complaint:   Postpartum Care  History of Present Illness:   Ana Manning is a 32 y.o. [id number] African American female being seen today for a postpartum visit. She is 7 weeks postpartum following a spontaneous vaginal delivery at 38.1 gestational weeks and inpatient BTL. Anesthesia: epidural. I have fully reviewed the prenatal and intrapartum course. Pregnancy uncomplicated. Postpartum course has been uncomplicated. Reports boil Lt buttocks x 1wk, put her daughters diaper w/ urine on it last night and feeling much better. Bleeding no bleeding. Bowel function is normal. Bladder function is normal.  Patient is not sexually active. Last sexual activity: prior to birth of baby.  Contraception method is tubal ligation.  [place] Postpartum Depression Screening: negative. Score 3.   Last pap 02/04/17.  Results were normal @ [facility] No LMP recorded.  Baby's course has been uncomplicated. Baby is feeding by breast at first, now bottle.  Review of Systems:   Pertinent items are noted in HPI Denies Abnormal vaginal discharge w/ itching/odor/irritation, headaches, visual changes, shortness of breath, chest pain, abdominal pain, severe nausea/vomiting, or problems with urination or bowel movements. Pertinent History Reviewed:  Reviewed past medical,surgical, obstetrical and family history.  Reviewed problem list, medications and allergies. OB History  Gravida Para Term Preterm AB Living  4 4 4     4   SAB TAB Ectopic Multiple Live Births        0 4    # Outcome Date GA Lbr Len/2nd Weight Sex Delivery Anes PTL Lv  4 Term 07/04/18 [redacted]w[redacted]d 06:12 6 lb 2.4 oz (2.79 kg) F VBAC EPI  LIV     Birth Comments: wnl  3 Term 12/2016 [redacted]w[redacted]d  6 lb 11 oz (3.033 kg) M VBAC EPI N LIV  2 Term 06/20/01 [redacted]w[redacted]d  7 lb 8 oz (3.402 kg) M VBAC EPI N LIV  1 Term 05/25/99 [redacted]w[redacted]d  7 lb 13 oz (3.544 kg) M CS-LVertical EPI N LIV   Complications: Failure to Progress in First Stage   Physical Assessment:   Vitals:   08/25/18 1356  BP: 101/60  Pulse: (!) 102  Weight: 126 lb (57.2 kg)  Height: 5\' 2"  (1.575 m)  Body mass index is 23.05 kg/m.       Physical Examination:   General appearance: alert, well appearing, and in no distress  Mental status: alert, oriented to person, place, and time  Skin: warm & dry   Cardiovascular: normal heart rate noted   Respiratory: normal respiratory effort, no distress   Breasts: deferred, no complaints   Abdomen: soft, non-tender   Pelvic: VULVA: ~2cm boil Lt lower labia/buttocks, firm and tender UTERUS: uterus is normal size, shape, consistency and nontender  Rectal: no hemorrhoids  Extremities: no edema       No results found for this or any previous visit (from the past 24 hour(s)).  Assessment & Plan:  1) Postpartum exam 2) 7 wks s/p SVB and inpatient BTL 3) Bottlefeeding 4) Depression screening 5) Boil vulva/buttocks> rx septra, f/u Monday, if worsening over weekend   Meds:  Meds ordered this encounter  Medications  . sulfamethoxazole-trimethoprim (BACTRIM DS,SEPTRA DS) 800-160 MG tablet    Sig: Take 1 tablet by mouth 2 (two) times daily. X 7 days    Dispense:  14 tablet    Refill:  0    Order Specific Question:   Supervising Provider    Answer:   Lazaro Arms [  [id number]]    Follow-up: Return for Monday for f/u.   No orders of the defined types were placed in this encounter.   Cheral MarkerKimberly R Candela Krul CNM, [facility]NP[facility] 08/25/2018 2:29 PM

## 2018-08-29 ENCOUNTER — Ambulatory Visit: Payer: Medicaid Other | Admitting: Women's Health

## 2018-11-24 IMAGING — US US OB LIMITED
1 series · 14 of 27 positions shown · non-contrast
Comparison: none

CLINICAL DATA: Supervision of third trimester pregnancy, uterine
size/date discrepancy, EGA 37 weeks 4 days based on first ultrasound
of 02/02/2018

EXAM:
LIMITED OBSTETRIC ULTRASOUND

[Series 1: us ob limited · 0.20mm/px · 27 acquisitions, 14 frames shown]
[im 1/27]
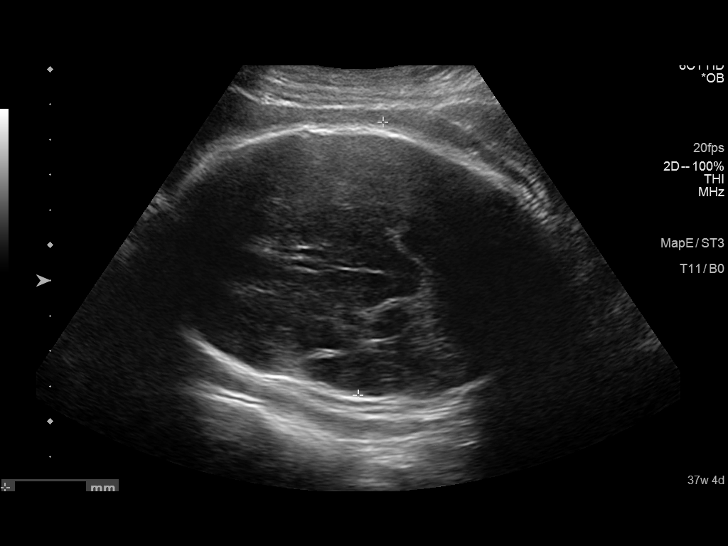
[im 3/27]
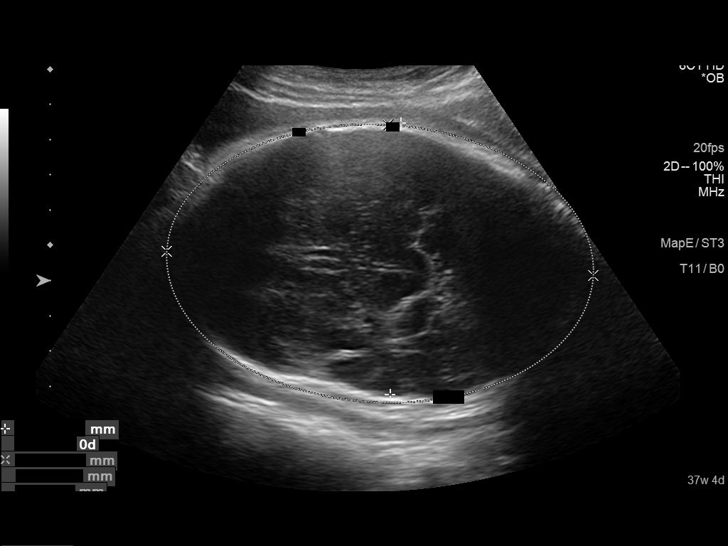
[im 5/27]
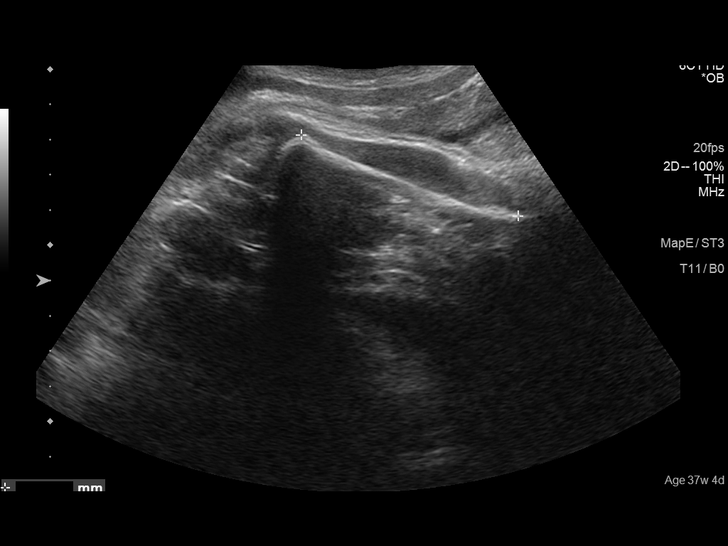
[im 7/27]
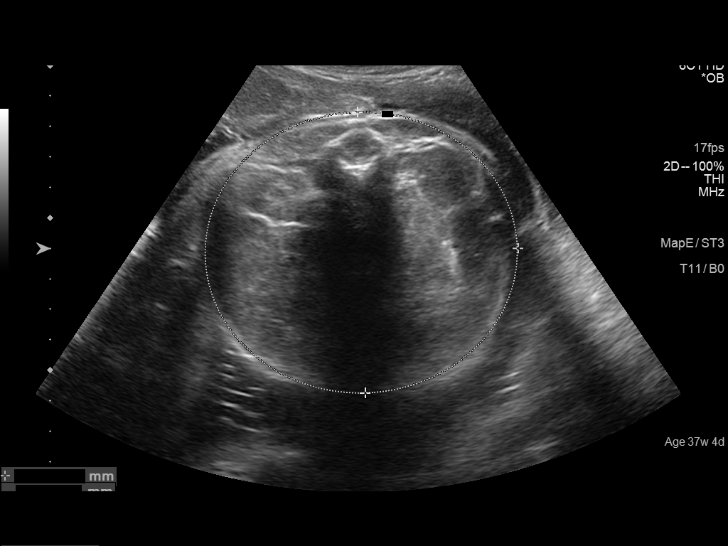
[im 9/27]
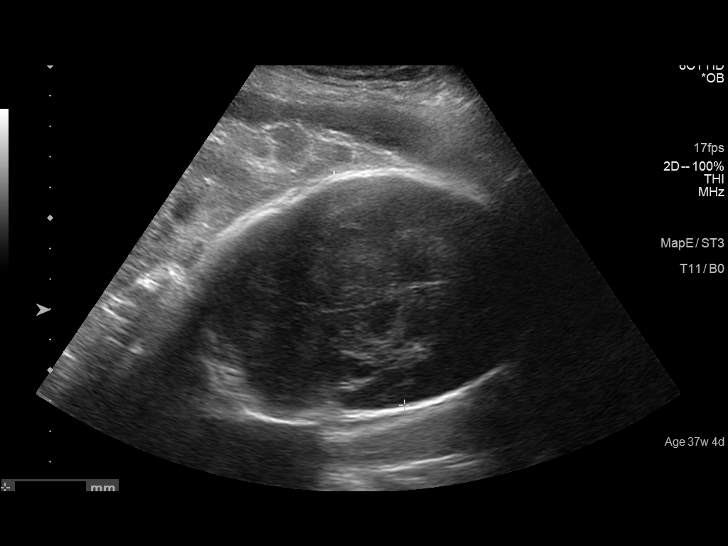
[im 11/27]
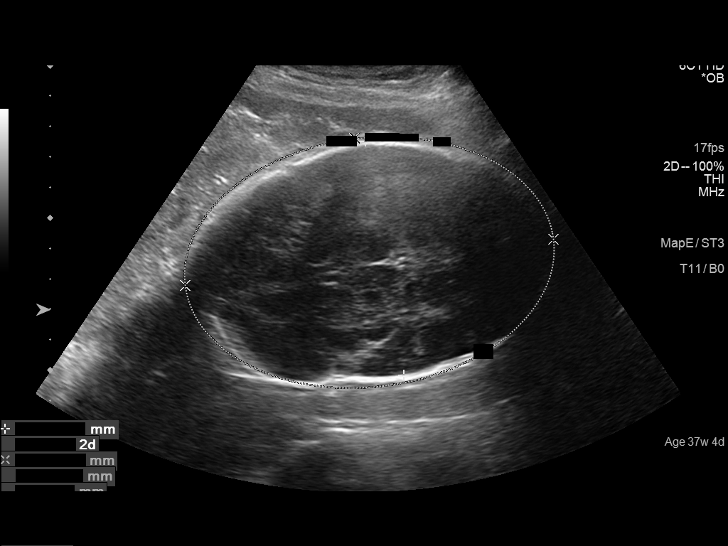
[im 13/27]
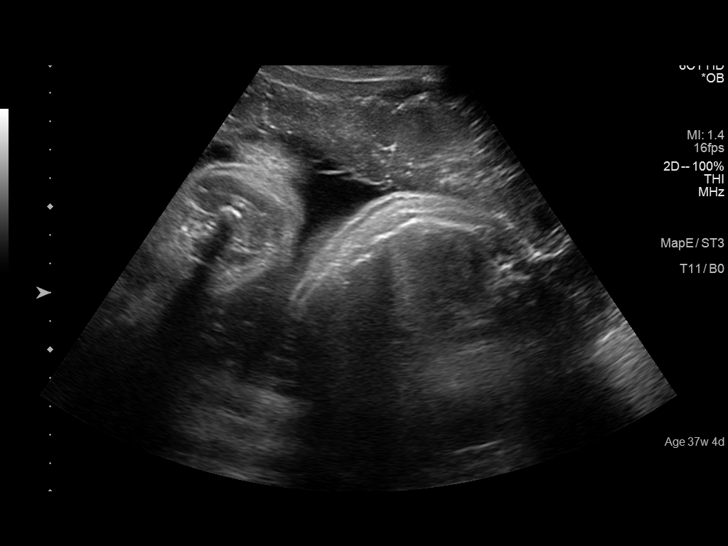
[im 15/27]
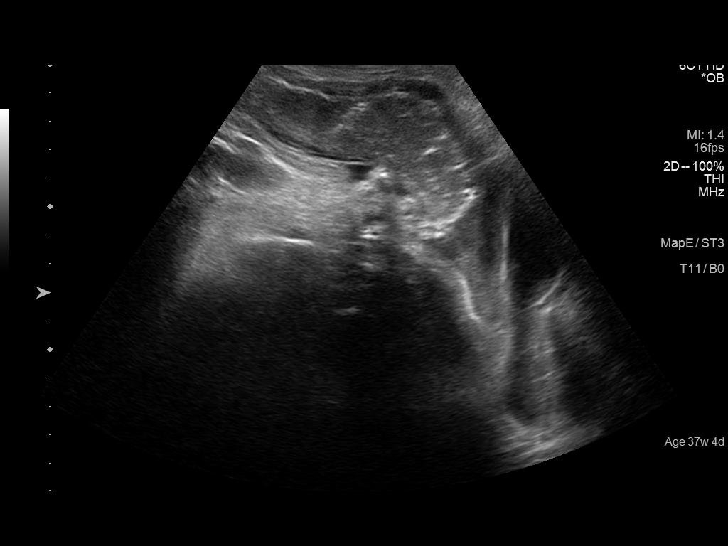
[im 17/27]
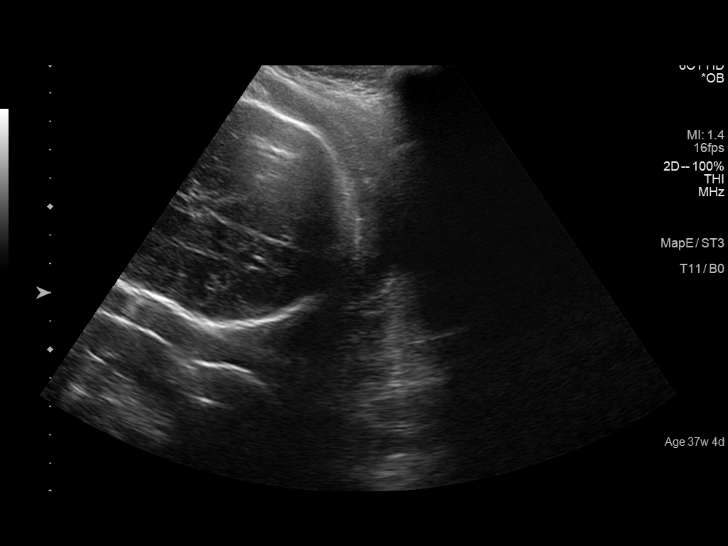
[im 19/27]
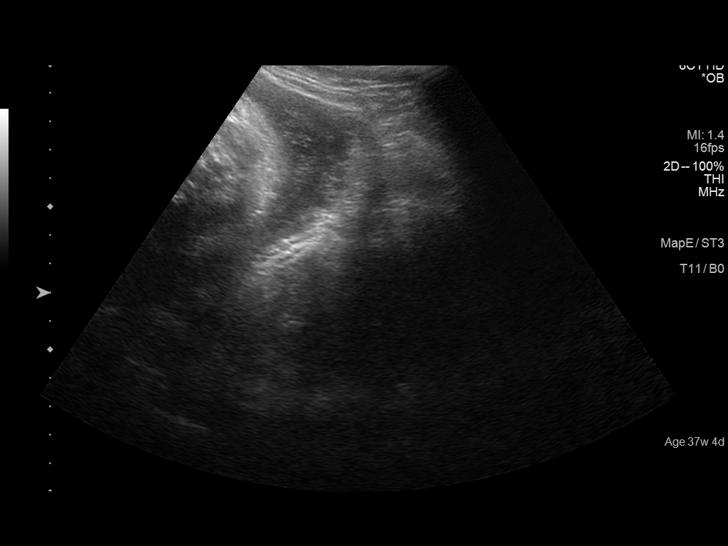
[im 21/27]
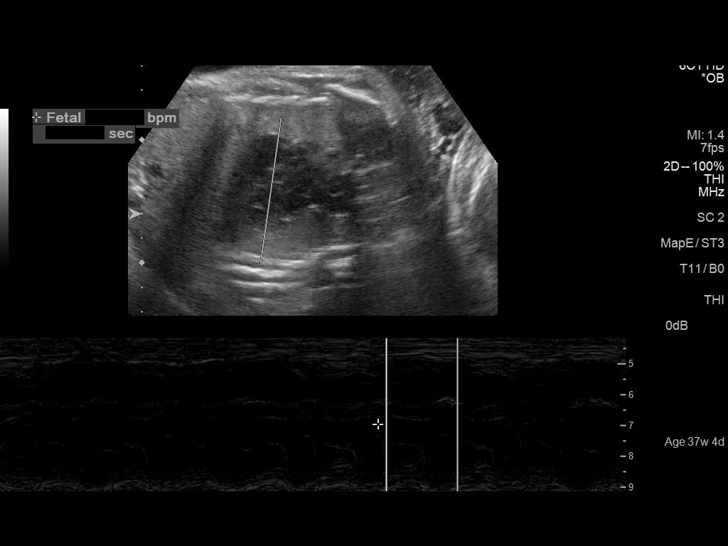
[im 23/27]
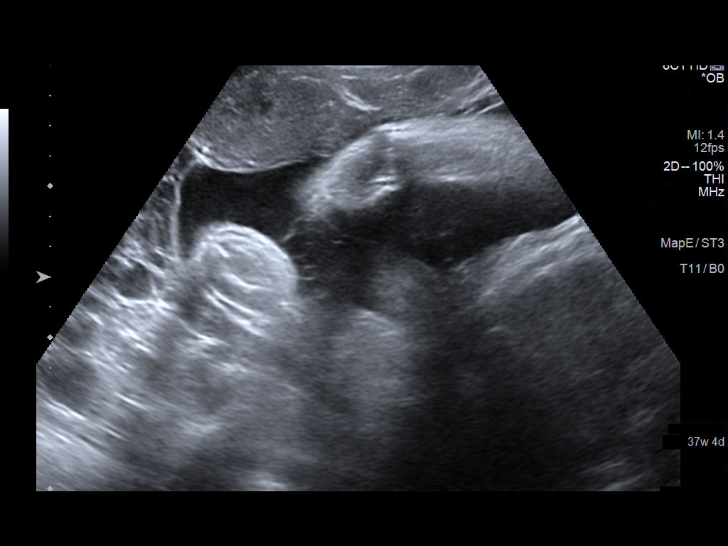
[im 25/27]
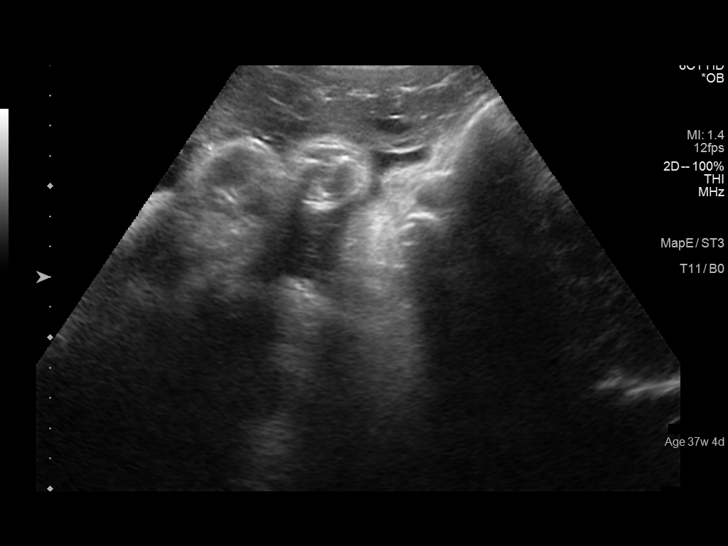
[im 27/27]
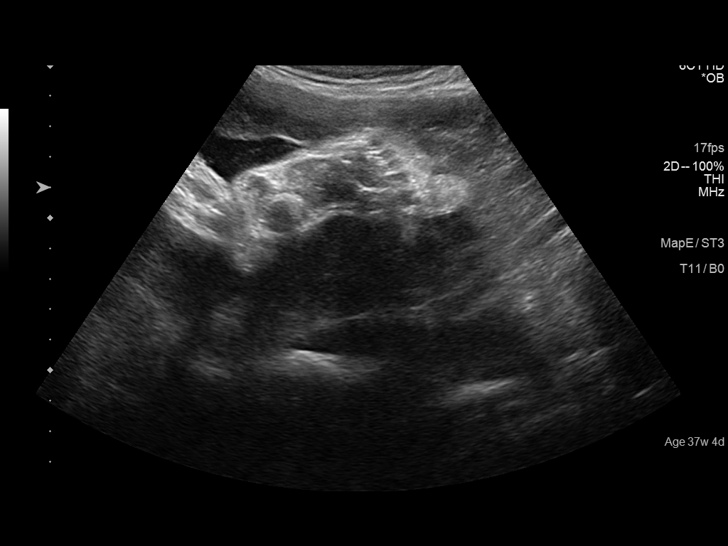

[14 of 27 positions shown; findings below may reference images not displayed]

FINDINGS: Number of Fetuses: 1

Heart Rate:  157 bpm

Movement: Present

Presentation: Cephalic

Placental Location: Anterior

Previa: None

Amniotic Fluid (Subjective): Within normal limits with largest
pocket 3.5 cm

BPD: 7.8 cm 31 w  2 d

HC: 31.8 cm 35 w 5 d

FL: 6.7 cm 35 w 2 d

Mean EGA: 34 w 2 d

EFW: 1945 gm (5 %ile)

MATERNAL FINDINGS:

Cervix:  Appears closed.

Uterus/Adnexae: No abnormality visualized.
IMPRESSION: Single live intrauterine gestation at 34 weeks 2 days EGA by
biometry.

This is less than the 37 weeks 4 days EGA based on initial
ultrasound of 02/02/2018.

This exam is performed on an emergent basis and does not
comprehensively evaluate fetal size, dating, or anatomy; follow-up
complete OB US should be considered if further fetal assessment is
warranted.

## 2024-11-09 ENCOUNTER — Emergency Department
Admission: EM | Admit: 2024-11-09 | Discharge: 2024-11-09 | Disposition: A | Discharge status: Home / Self Care | Payer: Self-pay | Attending: Emergency Medicine | Admitting: Emergency Medicine

## 2024-11-09 ENCOUNTER — Other Ambulatory Visit: Payer: Self-pay

## 2024-11-09 DIAGNOSIS — Y909 Presence of alcohol in blood, level not specified: Secondary | ICD-10-CM | POA: Insufficient documentation

## 2024-11-09 DIAGNOSIS — Z0000 Encounter for general adult medical examination without abnormal findings: Secondary | ICD-10-CM | POA: Insufficient documentation

## 2024-11-09 DIAGNOSIS — F1010 Alcohol abuse, uncomplicated: Secondary | ICD-10-CM | POA: Insufficient documentation

## 2024-11-09 DIAGNOSIS — F1090 Alcohol use, unspecified, uncomplicated: Secondary | ICD-10-CM

## 2024-11-09 DIAGNOSIS — Z139 Encounter for screening, unspecified: Secondary | ICD-10-CM

## 2024-11-09 NOTE — ED Notes (Signed)
 Pt refused labs.

## 2024-11-09 NOTE — ED Triage Notes (Signed)
 Pt was wandering in and out of the emergency department. When approached by staff pt sts I dont know what happened.  Endorses ETOH use. Denies SI/HI. I've been walking for a long time. I'm just so tired I just want to go to sleep.

## 2024-11-09 NOTE — ED Notes (Signed)
 Talking on cell phone, crying while talking on the phone. Denies any complaints. Alert and oriented x3 in no apparent distress.
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
## 2024-11-09 NOTE — ED Notes (Signed)
 Pt continues to ramble. Pacing in triage lobby. Pt was agreeable to follow RN back to [facility]. Informed EDP Cyrena of pt concerns. Security at bedside.
